# Patient Record
Sex: Female | Born: 1995
Health system: Southern US, Community
[De-identification: ages and names within clinical notes are randomized; demographics above are authoritative.]

---

## 2015-10-26 ENCOUNTER — Ambulatory Visit (INDEPENDENT_AMBULATORY_CARE_PROVIDER_SITE_OTHER): Payer: Self-pay | Admitting: Nurse Practitioner

## 2015-10-26 VITALS — BP 118/72 | HR 107 | Temp 99.3°F | Resp 20 | Ht 66.0 in | Wt 126.6 lb

## 2015-10-26 DIAGNOSIS — Z23 Encounter for immunization: Secondary | ICD-10-CM

## 2015-10-29 ENCOUNTER — Emergency Department (HOSPITAL_BASED_OUTPATIENT_CLINIC_OR_DEPARTMENT_OTHER): Payer: Medicaid Other

## 2015-10-29 ENCOUNTER — Encounter (HOSPITAL_BASED_OUTPATIENT_CLINIC_OR_DEPARTMENT_OTHER): Payer: Self-pay | Admitting: Emergency Medicine

## 2015-10-29 ENCOUNTER — Emergency Department (HOSPITAL_BASED_OUTPATIENT_CLINIC_OR_DEPARTMENT_OTHER)
Admission: EM | Admit: 2015-10-29 | Discharge: 2015-10-29 | Disposition: A | Discharge status: Home / Self Care | Payer: Medicaid Other | Attending: Emergency Medicine | Admitting: Emergency Medicine

## 2015-10-29 DIAGNOSIS — R6883 Chills (without fever): Secondary | ICD-10-CM | POA: Diagnosis not present

## 2015-10-29 DIAGNOSIS — N210 Calculus in bladder: Secondary | ICD-10-CM | POA: Diagnosis not present

## 2015-10-29 DIAGNOSIS — K8020 Calculus of gallbladder without cholecystitis without obstruction: Secondary | ICD-10-CM

## 2015-10-29 DIAGNOSIS — M549 Dorsalgia, unspecified: Secondary | ICD-10-CM | POA: Insufficient documentation

## 2015-10-29 DIAGNOSIS — R110 Nausea: Secondary | ICD-10-CM | POA: Diagnosis present

## 2015-10-29 LAB — CBC WITH DIFFERENTIAL/PLATELET
BASOS PCT: 0 %
Basophils Absolute: 0 10*3/uL (ref 0.0–0.1)
EOS ABS: 0.2 10*3/uL (ref 0.0–0.7)
Eosinophils Relative: 4 %
HCT: 39.8 % (ref 36.0–46.0)
HEMOGLOBIN: 13.5 g/dL (ref 12.0–15.0)
Lymphocytes Relative: 40 %
Lymphs Abs: 2.5 10*3/uL (ref 0.7–4.0)
MCH: 30.2 pg (ref 26.0–34.0)
MCHC: 33.9 g/dL (ref 30.0–36.0)
MCV: 89 fL (ref 78.0–100.0)
Monocytes Absolute: 0.5 10*3/uL (ref 0.1–1.0)
Monocytes Relative: 7 %
NEUTROS PCT: 49 %
Neutro Abs: 3 10*3/uL (ref 1.7–7.7)
Platelets: 264 10*3/uL (ref 150–400)
RBC: 4.47 MIL/uL (ref 3.87–5.11)
RDW: 12.2 % (ref 11.5–15.5)
WBC: 6.2 10*3/uL (ref 4.0–10.5)

## 2015-10-29 LAB — COMPREHENSIVE METABOLIC PANEL
ALK PHOS: 87 U/L (ref 38–126)
ALT: 12 U/L — ABNORMAL LOW (ref 14–54)
ANION GAP: 7 (ref 5–15)
AST: 16 U/L (ref 15–41)
Albumin: 4.5 g/dL (ref 3.5–5.0)
BILIRUBIN TOTAL: 0.5 mg/dL (ref 0.3–1.2)
BUN: 9 mg/dL (ref 6–20)
CALCIUM: 9.5 mg/dL (ref 8.9–10.3)
CO2: 25 mmol/L (ref 22–32)
Chloride: 106 mmol/L (ref 101–111)
Creatinine, Ser: 0.63 mg/dL (ref 0.44–1.00)
GFR calc non Af Amer: >60 mL/min (ref >60)
Glucose, Bld: 92 mg/dL (ref 65–99)
POTASSIUM: 4 mmol/L (ref 3.5–5.1)
SODIUM: 138 mmol/L (ref 135–145)
TOTAL PROTEIN: 7.3 g/dL (ref 6.5–8.1)

## 2015-10-29 LAB — URINALYSIS, ROUTINE W REFLEX MICROSCOPIC
BILIRUBIN URINE: NEGATIVE
GLUCOSE, UA: NEGATIVE mg/dL
HGB URINE DIPSTICK: NEGATIVE
Ketones, ur: NEGATIVE mg/dL
Nitrite: NEGATIVE
PH: 6 (ref 5.0–8.0)
Protein, ur: NEGATIVE mg/dL
SPECIFIC GRAVITY, URINE: 1.027 (ref 1.005–1.030)

## 2015-10-29 LAB — LIPASE, BLOOD: Lipase: 22 U/L (ref 11–51)

## 2015-10-29 LAB — PREGNANCY, URINE: Preg Test, Ur: NEGATIVE

## 2015-10-29 LAB — URINE MICROSCOPIC-ADD ON: RBC / HPF: NONE SEEN RBC/hpf (ref 0–5)

## 2015-10-29 MED ORDER — OMEPRAZOLE 20 MG PO CPDR: 20.0000 mg | DELAYED_RELEASE_CAPSULE | Freq: Every day | ORAL | 0 refills | Status: DC

## 2015-10-29 MED ORDER — NAPROXEN 250 MG PO TABS: 250.0000 mg | ORAL_TABLET | Freq: Two times a day (BID) | ORAL | 0 refills | Status: DC

## 2015-10-29 MED ORDER — ONDANSETRON 4 MG PO TBDP
4.0000 mg | ORAL_TABLET | Freq: Once | ORAL | Status: DC
  Filled 2015-10-29: qty 1

## 2015-10-29 MED ORDER — ONDANSETRON 4 MG PO TBDP: 4.0000 mg | ORAL_TABLET | Freq: Three times a day (TID) | ORAL | 0 refills | Status: DC | PRN

## 2015-10-29 MED ORDER — GI COCKTAIL ~~LOC~~
30.0000 mL | Freq: Once | ORAL | Status: DC
  Filled 2015-10-29: qty 30

## 2015-10-29 NOTE — ED Triage Notes (Signed)
Patient states that she is having Nausea x a few weeks. The patient reports that she may have a UTI - the patient reports fatigue

## 2015-10-29 NOTE — ED Provider Notes (Signed)
[facility] Provider Note   CSN: [id number] Arrival date & time: 10/29/15  1736  By signing my name below, I, Freida Busman, attest that this documentation has been prepared under the direction and in the presence of non-physician practitioner, Everlene Farrier, PA-C. Electronically Signed: Freida Busman, Scribe. 10/29/2015. 8:53 PM.   History   Chief Complaint Chief Complaint  Patient presents with  . Nausea    The history is provided by the patient. No language interpreter was used.     HPI Comments:  Gina Wheeler is a 20 y.o. female who presents to the Emergency Department complaining of intermittent episodes of nausea x 2-3 weeks. She notes she often feels nauseous after eating certain foods. Pt reports associated chills, fatigue, and occasional back pain. No treatments attempted.  No alleviating factors noted. She denies abdominal pain, dysuria, fever, cough, vaginal bleeding and vaginal discharge. Her last normal BM was today.  She also denies h/o abdominal surgeries. LNMP was ~ 3 weeks ago   History reviewed. No pertinent past medical history.  There are no active problems to display for this patient.   History reviewed. No pertinent surgical history.  OB History    No data available       Home Medications    Prior to Admission medications   Medication Sig Start Date End Date Taking? Authorizing Provider  naproxen (NAPROSYN) 250 MG tablet Take 1 tablet (250 mg total) by mouth 2 (two) times daily with a meal. As needed for pain 10/29/15   Everlene Farrier, PA-C  omeprazole (PRILOSEC) 20 MG capsule Take 1 capsule (20 mg total) by mouth daily. 10/29/15   Everlene Farrier, PA-C  ondansetron (ZOFRAN ODT) 4 MG disintegrating tablet Take 1 tablet (4 mg total) by mouth every 8 (eight) hours as needed for nausea or vomiting. 10/29/15   Everlene Farrier, PA-C    Family History History reviewed. No pertinent family history.  Social History Social History    Substance Use Topics  . Smoking status: Never Smoker  . Smokeless tobacco: Never Used  . Alcohol use No     Allergies   Review of patient's allergies indicates no known allergies.   Review of Systems Review of Systems  Constitutional: Positive for chills. Negative for fever.  HENT: Negative for congestion and sore throat.   Eyes: Negative for visual disturbance.  Respiratory: Negative for cough and shortness of breath.   Cardiovascular: Negative for chest pain.  Gastrointestinal: Positive for nausea. Negative for abdominal distention, abdominal pain, blood in stool, constipation, diarrhea and vomiting.  Genitourinary: Negative for dysuria, frequency, urgency, vaginal bleeding and vaginal discharge.  Musculoskeletal: Positive for back pain. Negative for neck pain.  Skin: Negative for rash.  Neurological: Negative for headaches.   Physical Exam Updated Vital Signs BP 115/82 (BP Location: Right Arm)   Pulse 82   Resp 18   Ht 5\' 6"  (1.676 m)   Wt 57.2 kg   LMP 09/14/2015 (Approximate)   SpO2 98%   BMI 20.34 kg/m   Physical Exam  Constitutional: She appears well-developed and well-nourished. No distress.  Nontoxic appearing.  HENT:  Head: Normocephalic and atraumatic.  Mouth/Throat: Oropharynx is clear and moist.  Eyes: Conjunctivae are normal. Pupils are equal, round, and reactive to light. Right eye exhibits no discharge. Left eye exhibits no discharge.  Neck: Neck supple.  Cardiovascular: Normal rate, regular rhythm, normal heart sounds and intact distal pulses.  Exam reveals no gallop and no friction rub.   No murmur  heard. Pulmonary/Chest: Effort normal and breath sounds normal. No respiratory distress. She has no wheezes. She has no rales.  Abdominal: Soft. Bowel sounds are normal. She exhibits no distension and no mass. There is no tenderness. There is no rebound and no guarding.  Abdomen soft, nontender to palpation. Bowel sounds present  No peritoneal signs   Musculoskeletal: She exhibits no edema.  Lymphadenopathy:    She has no cervical adenopathy.  Neurological: She is alert. Coordination normal.  Skin: Skin is warm and dry. Capillary refill takes less than 2 seconds. No rash noted. She is not diaphoretic. No erythema. No pallor.  Psychiatric: She has a normal mood and affect. Her behavior is normal.  Nursing note and vitals reviewed.    ED Treatments / Results  DIAGNOSTIC STUDIES:  Oxygen Saturation is 99% on RA, normal by my interpretation.    COORDINATION OF CARE:  8:50 PM Discussed treatment plan with pt at bedside and pt agreed to plan.  Labs (all labs ordered are listed, but only abnormal results are displayed) Results for orders placed or performed during the hospital encounter of 10/29/15  Pregnancy, urine  Result Value Ref Range   Preg Test, Ur NEGATIVE NEGATIVE  Urinalysis, Routine w reflex microscopic (not at [facility])  Result Value Ref Range   Color, Urine YELLOW YELLOW   APPearance CLOUDY (A) CLEAR   Specific Gravity, Urine 1.027 1.005 - 1.030   pH 6.0 5.0 - 8.0   Glucose, UA NEGATIVE NEGATIVE mg/dL   Hgb urine dipstick NEGATIVE NEGATIVE   Bilirubin Urine NEGATIVE NEGATIVE   Ketones, ur NEGATIVE NEGATIVE mg/dL   Protein, ur NEGATIVE NEGATIVE mg/dL   Nitrite NEGATIVE NEGATIVE   Leukocytes, UA SMALL (A) NEGATIVE  Urine microscopic-add on  Result Value Ref Range   Squamous Epithelial / LPF 0-5 (A) NONE SEEN   WBC, UA 0-5 0 - 5 WBC/hpf   RBC / HPF NONE SEEN 0 - 5 RBC/hpf   Bacteria, UA FEW (A) NONE SEEN   Urine-Other MUCOUS PRESENT   Comprehensive metabolic panel  Result Value Ref Range   Sodium 138 135 - 145 mmol/L   Potassium 4.0 3.5 - 5.1 mmol/L   Chloride 106 101 - 111 mmol/L   CO2 25 22 - 32 mmol/L   Glucose, Bld 92 65 - 99 mg/dL   BUN 9 6 - 20 mg/dL   Creatinine, Ser 1.61 0.44 - 1.00 mg/dL   Calcium 9.5 8.9 - 09.6 mg/dL   Total Protein 7.3 6.5 - 8.1 g/dL   Albumin 4.5 3.5 - 5.0 g/dL   AST 16 15 - 41  U/L   ALT 12 (L) 14 - 54 U/L   Alkaline Phosphatase 87 38 - 126 U/L   Total Bilirubin 0.5 0.3 - 1.2 mg/dL   GFR calc non Af Amer >60 >60 mL/min   GFR calc Af Amer >60 >60 mL/min   Anion gap 7 5 - 15  Lipase, blood  Result Value Ref Range   Lipase 22 11 - 51 U/L  CBC with Differential  Result Value Ref Range   WBC 6.2 4.0 - 10.5 K/uL   RBC 4.47 3.87 - 5.11 MIL/uL   Hemoglobin 13.5 12.0 - 15.0 g/dL   HCT 04.5 40.9 - 81.1 %   MCV 89.0 78.0 - 100.0 fL   MCH 30.2 26.0 - 34.0 pg   MCHC 33.9 30.0 - 36.0 g/dL   RDW 91.4 78.2 - 95.6 %   Platelets 264 150 - 400 K/uL  Neutrophils Relative % 49 %   Neutro Abs 3.0 1.7 - 7.7 K/uL   Lymphocytes Relative 40 %   Lymphs Abs 2.5 0.7 - 4.0 K/uL   Monocytes Relative 7 %   Monocytes Absolute 0.5 0.1 - 1.0 K/uL   Eosinophils Relative 4 %   Eosinophils Absolute 0.2 0.0 - 0.7 K/uL   Basophils Relative 0 %   Basophils Absolute 0.0 0.0 - 0.1 K/uL    EKG  EKG Interpretation None       Radiology US Abdomen Limited Ruq  Result Date: 10/29/2015 CLINICAL DATA:  Nausea for 3 weeks. EXAM: US ABDOMEN LIMITED - RIGHT UPPER QUADRANT COMPARISON:  None. FINDINGS: Gallbladder: Gallbladder is mildly contracted. This is nonspecific but may indicate chronic dysmotility in a fasting patient. Small amount of sludge in the gallbladder. No stones or wall thickening. Murphy's sign is negative. Common bile duct: Diameter: 1 mm, normal Liver: No focal lesion identified. Within normal limits in parenchymal echogenicity. IMPRESSION: Gallbladder is mildly contracted with sludge possibly indicating decreased motility. No changes to suggest acute cholecystitis. Electronically Signed   By: Burman Nieves M.D.   On: 10/29/2015 22:25    Procedures Procedures (including critical care time)  Medications Ordered in ED Medications  gi cocktail (Maalox,Lidocaine,Donnatal) (30 mLs Oral Not Given 10/29/15 2102)  ondansetron (ZOFRAN-ODT) disintegrating tablet 4 mg (4 mg Oral  Not Given 10/29/15 2103)     Initial Impression / Assessment and Plan / ED Course  I have reviewed the triage vital signs and the nursing notes.  Pertinent labs & imaging results that were available during my care of the patient were reviewed by me and considered in my medical decision making (see chart for details).  Clinical Course     Patient presented to the emergency department complaining of intermittent nausea for the past 2-3 weeks but she also reports some chills and fatigue. No abdominal pain. No vomiting. On exam the patient is afebrile nontoxic appearing. Her abdomen is soft and nontender to palpation. She reports symptoms are worse with eating. Urinalysis is nitrite negative with small leukocytes. Urine sent for culture. Patient denied any urinary symptoms. Pregnancy test is negative. CBC is within normal limits. Lipase is within normal limits. CMP is unremarkable. No elevated liver enzymes. Right upper quadrant ultrasound revealed gallbladder is mildly contracted with sludge possibly indicating decreased motility. No changes to suggest acute cholecystitis. I discussed these findings with the patient. She is tolerating ginger ale and crackers in the room. Will discharge with prescriptions for Zofran and omeprazole. Will have her follow-up with general surgery as she possibly will require cholecystectomy. I discussed diet changes to help with gallbladder colic. I advised the patient to follow-up with their primary care provider this week. I advised the patient to return to the emergency department with new or worsening symptoms or new concerns. The patient verbalized understanding and agreement with plan.       Final Clinical Impressions(s) / ED Diagnoses   Final diagnoses:  Nausea  Gallbladder colic  Nausea without vomiting    New Prescriptions Discharge Medication List as of 10/29/2015 10:58 PM    START taking these medications   Details  naproxen (NAPROSYN) 250 MG  tablet Take 1 tablet (250 mg total) by mouth 2 (two) times daily with a meal. As needed for pain, Starting Thu 10/29/2015, Print    omeprazole (PRILOSEC) 20 MG capsule Take 1 capsule (20 mg total) by mouth daily., Starting Thu 10/29/2015, Print    ondansetron [facility]  ODT) 4 MG disintegrating tablet Take 1 tablet (4 mg total) by mouth every 8 (eight) hours as needed for nausea or vomiting., Starting Thu 10/29/2015, Print       I personally performed the services described in this documentation, which was scribed in my presence. The recorded information has been reviewed and is accurate.       Everlene FarrierWilliam Vint Pola, PA-C 10/30/15 0143    Benjiman CoreNathan Pickering, MD 10/31/15 [phone]

## 2015-11-01 LAB — URINE CULTURE

## 2015-11-05 ENCOUNTER — Ambulatory Visit: Payer: Self-pay | Attending: Internal Medicine | Admitting: Physician Assistant

## 2015-11-05 ENCOUNTER — Encounter: Payer: Self-pay | Admitting: Physician Assistant

## 2015-11-05 VITALS — BP 108/71 | HR 95 | Temp 98.4°F | Resp 18 | Ht 66.5 in | Wt 126.8 lb

## 2015-11-05 DIAGNOSIS — R12 Heartburn: Secondary | ICD-10-CM

## 2015-11-05 DIAGNOSIS — R1013 Epigastric pain: Secondary | ICD-10-CM

## 2015-11-05 MED ORDER — OMEPRAZOLE 20 MG PO CPDR: 20.0000 mg | DELAYED_RELEASE_CAPSULE | Freq: Every day | ORAL | 2 refills | Status: AC

## 2015-11-05 NOTE — Progress Notes (Signed)
Gina Wheeler, is a 20 y.o. female  WUJ:[id number]:[id number]  [id number]  DOB - [birth year]  Subjective:  Chief Complaint and HPI: Gina Wheeler is a 20 y.o. female here today to establish care and for a follow up visit after being seen in the ED for nausea without vomiting on 10/29/2015. She has been having nausea and intermittent abdominal pain for 2-3 weeks.   Gallbladder U/S showed:  IMPRESSION: Gallbladder is mildly contracted with sludge possibly indicating decreased motility. No changes to suggest acute cholecystitis CMP, CBC, urine pregnancy and UA all negative/WNL  She continues to have intermittent mid-epigastric pain that is worse with eating certain foods.  She continues to have intermittent nausea.  Her appetite is good.  No vomiting or diarrhea.  She denies urinary symptoms or vaginal discharge.  No pelvic pain.  [place] travelled to [place] about 2 [place] ago.  No f/c.    ED/Hospital notes reviewed.     ROS:   Constitutional:  No f/c, No night sweats, No unexplained weight loss. EENT:  No vision changes, No blurry vision, No hearing changes. No mouth, throat, or ear problems.  Respiratory: No cough, No SOB Cardiac: No CP, no palpitations GI:  +abd pain, +nausea, no V/D. GU: No Urinary s/sx Musculoskeletal: No joint pain Neuro: No headache, no dizziness, no motor weakness.  Skin: No rash Endocrine:  No polydipsia. No polyuria.  Psych: Denies SI/HI  No problems updated.  ALLERGIES: No Known Allergies  PAST MEDICAL HISTORY: No past medical history on file.  MEDICATIONS AT HOME: Prior to Admission medications   Medication Sig Start Date End Date Taking? Authorizing Provider  naproxen (NAPROSYN) 250 MG tablet Take 1 tablet (250 mg total) by mouth 2 (two) times daily with a meal. As needed for pain 10/29/15  Yes Everlene FarrierWilliam Dansie, PA-C  omeprazole (PRILOSEC) 20 MG capsule Take 1 capsule (20 mg total) by mouth daily. 11/05/15   Anders SimmondsAngela M McClung, PA-C    ondansetron (ZOFRAN ODT) 4 MG disintegrating tablet Take 1 tablet (4 mg total) by mouth every 8 (eight) hours as needed for nausea or vomiting. Patient not taking: Reported on 11/05/2015 10/29/15   Everlene FarrierWilliam Dansie, PA-C     Objective:  EXAM:   Vitals:   11/05/15 1438  BP: 108/71  Pulse: 95  Resp: 18  Temp: 98.4 F (36.9 C)  TempSrc: Oral  SpO2: 99%  Weight: 126 lb 12.8 oz (57.5 kg)  Height: 5' 6.5" (1.689 m)    General appearance : A&OX3. NAD. Non-toxic-appearing HEENT: Atraumatic and Normocephalic.  PERRLA. EOM intact.  TM clear B. Mouth-MMM, post pharynx WNL w/o erythema, No PND. Neck: supple, no JVD. No cervical lymphadenopathy. No thyromegaly Chest/Lungs:  Breathing-non-labored, Good air entry bilaterally, breath sounds normal without rales, rhonchi, or wheezing  CVS: S1 S2 regular, no murmurs, gallops, rubs  Abdomen: Bowel sounds present, Non tender and not distended with no gaurding, rigidity or rebound. Extremities: Bilateral Lower Ext shows no edema, both legs are warm to touch with = pulse throughout Neurology:  CN II-XII grossly intact, Non focal.   Psych:  TP linear. J/I WNL. Normal speech. Appropriate eye contact and affect.  Skin:  No Rash  Data Review No results found for: HGBA1C   Assessment & Plan   1. Heartburn - omeprazole (PRILOSEC) 20 MG capsule; Take 1 capsule (20 mg total) by mouth daily.  Dispense: 30 capsule; Refill: 2 - H. pylori breath test  2. Midepigastric pain +decreased gallbladder motility possible on U/S.  Consider GI  referral if s/sx persist.  - H. pylori breath test Reflux/heartburn diet recommended  Patient have been counseled extensively about nutrition and exercise  Return in about 4 weeks (around 12/03/2015) for establish with PCP and recheck nausea.  The patient was given clear instructions to go to ER or return to medical center if symptoms don't improve, worsen or new problems develop. The patient verbalized understanding.  The patient was told to call to get lab results if they haven't heard anything in the next week.     Georgian Co, PA-C [facility] [place], [place] [phone]   11/05/2015, 3:11 PMPatient ID: Gina Wheeler, female   DOB: 07-Apr-1995, 20 y.o.   MRN: [id number]

## 2015-11-05 NOTE — Patient Instructions (Signed)
Food Choices for Gastroesophageal Reflux Disease, Adult When you have gastroesophageal reflux disease (GERD), the foods you eat and your eating habits are very important. Choosing the right foods can help ease the discomfort of GERD. WHAT GENERAL GUIDELINES DO I NEED TO FOLLOW?  Choose fruits, vegetables, whole grains, low-fat dairy products, and low-fat meat, fish, and poultry.  Limit fats such as oils, salad dressings, butter, nuts, and avocado.  Keep a food diary to identify foods that cause symptoms.  Avoid foods that cause reflux. These may be different for different people.  Eat frequent small meals instead of three large meals each day.  Eat your meals slowly, in a relaxed setting.  Limit fried foods.  Cook foods using methods other than frying.  Avoid drinking alcohol.  Avoid drinking large amounts of liquids with your meals.  Avoid bending over or lying down until 2-3 hours after eating. WHAT FOODS ARE NOT RECOMMENDED? The following are some foods and drinks that may worsen your symptoms: Vegetables Tomatoes. Tomato juice. Tomato and spaghetti sauce. Chili peppers. Onion and garlic. Horseradish. Fruits Oranges, grapefruit, and lemon (fruit and juice). Meats High-fat meats, fish, and poultry. This includes hot dogs, ribs, ham, sausage, salami, and bacon. Dairy Whole milk and chocolate milk. Sour cream. Cream. Butter. Ice cream. Cream cheese.  Beverages Coffee and tea, with or without caffeine. Carbonated beverages or energy drinks. Condiments Hot sauce. Barbecue sauce.  Sweets/Desserts Chocolate and cocoa. Donuts. Peppermint and spearmint. Fats and Oils High-fat foods, including French fries and potato chips. Other Vinegar. Strong spices, such as black pepper, white pepper, red pepper, cayenne, curry powder, cloves, ginger, and chili powder. The items listed above may not be a complete list of foods and beverages to avoid. Contact your dietitian for more  information.   This information is not intended to replace advice given to you by your health care provider. Make sure you discuss any questions you have with your health care provider.   Document Released: 01/24/2005 Document Revised: 02/14/2014 Document Reviewed: 11/28/2012 Elsevier Interactive Patient Education 2016 [employer]  

## 2015-11-06 LAB — H. PYLORI BREATH TEST: H. pylori Breath Test: NOT DETECTED

## 2015-11-12 ENCOUNTER — Telehealth: Payer: Self-pay

## 2015-11-12 ENCOUNTER — Telehealth: Payer: Self-pay | Admitting: General Practice

## 2015-11-12 NOTE — Telephone Encounter (Signed)
Patient returned nurse phone call. °Please follow up. °

## 2015-11-12 NOTE — Telephone Encounter (Signed)
Contacted pt to go over lab results pt didn't answer asked pt to give me a call back at her earliest convenience

## 2015-11-24 ENCOUNTER — Ambulatory Visit (INDEPENDENT_AMBULATORY_CARE_PROVIDER_SITE_OTHER): Payer: Self-pay | Admitting: Family Medicine

## 2015-11-24 ENCOUNTER — Other Ambulatory Visit: Payer: Self-pay | Admitting: *Deleted

## 2015-11-24 DIAGNOSIS — Z111 Encounter for screening for respiratory tuberculosis: Secondary | ICD-10-CM

## 2015-11-27 LAB — TB SKIN TEST
Induration: 0 mm
TB SKIN TEST: NEGATIVE

## 2015-12-01 ENCOUNTER — Ambulatory Visit: Payer: Medicaid Other | Attending: Internal Medicine | Admitting: Internal Medicine

## 2015-12-01 ENCOUNTER — Encounter: Payer: Self-pay | Admitting: Internal Medicine

## 2015-12-01 VITALS — BP 110/76 | HR 91 | Temp 99.0°F | Resp 16 | Wt 120.6 lb

## 2015-12-01 DIAGNOSIS — Z23 Encounter for immunization: Secondary | ICD-10-CM

## 2015-12-01 DIAGNOSIS — Z79899 Other long term (current) drug therapy: Secondary | ICD-10-CM | POA: Diagnosis not present

## 2015-12-01 DIAGNOSIS — K219 Gastro-esophageal reflux disease without esophagitis: Secondary | ICD-10-CM | POA: Insufficient documentation

## 2015-12-01 DIAGNOSIS — Z131 Encounter for screening for diabetes mellitus: Secondary | ICD-10-CM

## 2015-12-01 DIAGNOSIS — R634 Abnormal weight loss: Secondary | ICD-10-CM | POA: Insufficient documentation

## 2015-12-01 DIAGNOSIS — R8299 Other abnormal findings in urine: Secondary | ICD-10-CM | POA: Insufficient documentation

## 2015-12-01 DIAGNOSIS — R8290 Unspecified abnormal findings in urine: Secondary | ICD-10-CM

## 2015-12-01 DIAGNOSIS — E46 Unspecified protein-calorie malnutrition: Secondary | ICD-10-CM | POA: Diagnosis not present

## 2015-12-01 DIAGNOSIS — E441 Mild protein-calorie malnutrition: Secondary | ICD-10-CM

## 2015-12-01 DIAGNOSIS — Z113 Encounter for screening for infections with a predominantly sexual mode of transmission: Secondary | ICD-10-CM

## 2015-12-01 LAB — POCT URINALYSIS DIPSTICK
BILIRUBIN UA: NEGATIVE
Blood, UA: NEGATIVE
Glucose, UA: NEGATIVE
KETONES UA: 15
Nitrite, UA: NEGATIVE
PROTEIN UA: 30
Spec Grav, UA: 1.02
Urobilinogen, UA: 2
pH, UA: 7

## 2015-12-01 LAB — POCT GLYCOSYLATED HEMOGLOBIN (HGB A1C): Hemoglobin A1C: 5.1

## 2015-12-01 NOTE — Patient Instructions (Addendum)
Tdap Vaccine (Tetanus, Diphtheria and Pertussis): What You Need to Know 1. Why get vaccinated? Tetanus, diphtheria and pertussis are very serious diseases. Tdap vaccine can protect us from these diseases. And, Tdap vaccine given to pregnant women can protect newborn babies against pertussis. TETANUS (Lockjaw) is rare in the [place] today. It causes painful muscle tightening and stiffness, usually all over the body.  It can lead to tightening of muscles in the head and neck so you can't open your mouth, swallow, or sometimes even breathe. Tetanus kills about 1 out of 10 people who are infected even after receiving the best medical care. DIPHTHERIA is also rare in the [place] today. It can cause a thick coating to form in the back of the throat.  It can lead to breathing problems, heart failure, paralysis, and death. PERTUSSIS (Whooping Cough) causes severe coughing spells, which can cause difficulty breathing, vomiting and disturbed sleep.  It can also lead to weight loss, incontinence, and rib fractures. Up to 2 in 100 adolescents and 5 in 100 adults with pertussis are hospitalized or have complications, which could include pneumonia or death. These diseases are caused by bacteria. Diphtheria and pertussis are spread from person to person through secretions from coughing or sneezing. Tetanus enters the body through cuts, scratches, or wounds. Before vaccines, as many as 200,000 cases of diphtheria, 200,000 cases of pertussis, and hundreds of cases of tetanus, were reported in the [place] each year. Since vaccination began, reports of cases for tetanus and diphtheria have dropped by about 99% and for pertussis by about 80%. 2. Tdap vaccine Tdap vaccine can protect adolescents and adults from tetanus, diphtheria, and pertussis. One dose of Tdap is routinely given at age 11 or 12. People who did not get Tdap at that age should get it as soon as possible. Tdap is especially important  for healthcare professionals and anyone having close contact with a baby younger than 12 months. Pregnant women should get a dose of Tdap during every pregnancy, to protect the newborn from pertussis. Infants are most at risk for severe, life-threatening complications from pertussis. Another vaccine, called Td, protects against tetanus and diphtheria, but not pertussis. A Td booster should be given every 10 years. Tdap may be given as one of these boosters if you have never gotten Tdap before. Tdap may also be given after a severe cut or burn to prevent tetanus infection. Your doctor or the person giving you the vaccine can give you more information. Tdap may safely be given at the same time as other vaccines. 3. Some people should not get this vaccine  A person who has ever had a life-threatening allergic reaction after a previous dose of any diphtheria, tetanus or pertussis containing vaccine, OR has a severe allergy to any part of this vaccine, should not get Tdap vaccine. Tell the person giving the vaccine about any severe allergies.  Anyone who had coma or long repeated seizures within 7 days after a childhood dose of DTP or DTaP, or a previous dose of Tdap, should not get Tdap, unless a cause other than the vaccine was found. They can still get Td.  Talk to your doctor if you:  have seizures or another nervous system problem,  had severe pain or swelling after any vaccine containing diphtheria, tetanus or pertussis,  ever had a condition called Guillain-Barr Syndrome (GBS),  aren't feeling well on the day the shot is scheduled. 4. Risks With any medicine, including vaccines, there is   a chance of side effects. These are usually mild and go away on their own. Serious reactions are also possible but are rare. Most people who get Tdap vaccine do not have any problems with it. Mild problems following Tdap (Did not interfere with activities)  Pain where the shot was given (about 3 in 4  adolescents or 2 in 3 adults)  Redness or swelling where the shot was given (about 1 person in 5)  Mild fever of at least 100.4F (up to about 1 in 25 adolescents or 1 in 100 adults)  Headache (about 3 or 4 people in 10)  Tiredness (about 1 person in 3 or 4)  Nausea, vomiting, diarrhea, stomach ache (up to 1 in 4 adolescents or 1 in 10 adults)  Chills, sore joints (about 1 person in 10)  Body aches (about 1 person in 3 or 4)  Rash, swollen glands (uncommon) Moderate problems following Tdap (Interfered with activities, but did not require medical attention)  Pain where the shot was given (up to 1 in 5 or 6)  Redness or swelling where the shot was given (up to about 1 in 16 adolescents or 1 in 12 adults)  Fever over 102F (about 1 in 100 adolescents or 1 in 250 adults)  Headache (about 1 in 7 adolescents or 1 in 10 adults)  Nausea, vomiting, diarrhea, stomach ache (up to 1 or 3 people in 100)  Swelling of the entire arm where the shot was given (up to about 1 in 500). Severe problems following Tdap (Unable to perform usual activities; required medical attention)  Swelling, severe pain, bleeding and redness in the arm where the shot was given (rare). Problems that could happen after any vaccine:  People sometimes faint after a medical procedure, including vaccination. Sitting or lying down for about 15 minutes can help prevent fainting, and injuries caused by a fall. Tell your doctor if you feel dizzy, or have vision changes or ringing in the ears.  Some people get severe pain in the shoulder and have difficulty moving the arm where a shot was given. This happens very rarely.  Any medication can cause a severe allergic reaction. Such reactions from a vaccine are very rare, estimated at fewer than 1 in a million doses, and would happen within a few minutes to a few hours after the vaccination. As with any medicine, there is a very remote chance of a vaccine causing a serious  injury or death. The safety of vaccines is always being monitored. For more information, visit: www.cdc.gov/vaccinesafety/ 5. What if there is a serious problem? What should I look for?  Look for anything that concerns you, such as signs of a severe allergic reaction, very high fever, or unusual behavior.  Signs of a severe allergic reaction can include hives, swelling of the face and throat, difficulty breathing, a fast heartbeat, dizziness, and weakness. These would usually start a few minutes to a few hours after the vaccination. What should I do?  If you think it is a severe allergic reaction or other emergency that can't wait, call 9-1-1 or get the person to the nearest hospital. Otherwise, call your doctor.  Afterward, the reaction should be reported to the Vaccine Adverse Event Reporting System (VAERS). Your doctor might file this report, or you can do it yourself through the VAERS web site at www.vaers.hhs.gov, or by calling 1-800-822-7967. VAERS does not give medical advice.  6. The National Vaccine Injury Compensation Program The National Vaccine Injury Compensation Program (  VICP) is a federal program that was created to compensate people who may have been injured by certain vaccines. Persons who believe they may have been injured by a vaccine can learn about the program and about filing a claim by calling 1-(318)044-5983 or visiting the VICP website at SpiritualWord.at. There is a time limit to file a claim for compensation. 7. How can I learn more?  Ask your doctor. He or she can give you the vaccine package insert or suggest other sources of information.  Call your local or state health department.  Contact the Centers for Disease Control and Prevention (CDC):  Call 239-031-8492 (1-800-CDC-INFO) or  Visit CDC's website at PicCapture.uy CDC Tdap Vaccine VIS (04/02/13)   This information is not intended to replace advice given to you by your health care  provider. Make sure you discuss any questions you have with your health care provider.   Document Released: 07/26/2011 Document Revised: 02/14/2014 Document Reviewed: 05/08/2013 Elsevier Interactive Patient Education 2016 [employer]   - Health Maintenance, Female Adopting a healthy lifestyle and getting preventive care can go a long way to promote health and wellness. Talk with your health care provider about what schedule of regular examinations is right for you. This is a good chance for you to check in with your provider about disease prevention and staying healthy. In between checkups, there are plenty of things you can do on your own. Experts have done a lot of research about which lifestyle changes and preventive measures are most likely to keep you healthy. Ask your health care provider for more information. WEIGHT AND DIET  Eat a healthy diet  Be sure to include plenty of vegetables, fruits, low-fat dairy products, and lean protein.  Do not eat a lot of foods high in solid fats, added sugars, or salt.  Get regular exercise. This is one of the most important things you can do for your health.  Most adults should exercise for at least 150 minutes each week. The exercise should increase your heart rate and make you sweat (moderate-intensity exercise).  Most adults should also do strengthening exercises at least twice a week. This is in addition to the moderate-intensity exercise.  Maintain a healthy weight  Body mass index (BMI) is a measurement that can be used to identify possible weight problems. It estimates body fat based on height and weight. Your health care provider can help determine your BMI and help you achieve or maintain a healthy weight.  For females 13 years of age and older:   A BMI below 18.5 is considered underweight.  A BMI of 18.5 to 24.9 is normal.  A BMI of 25 to 29.9 is considered overweight.  A BMI of 30 and above is considered obese.  Watch  levels of cholesterol and blood lipids  You should start having your blood tested for lipids and cholesterol at 20 years of age, then have this test every 5 years.  You may need to have your cholesterol levels checked more often if:  Your lipid or cholesterol levels are high.  You are older than 20 years of age.  You are at high risk for heart disease.  CANCER SCREENING   Lung Cancer  Lung cancer screening is recommended for adults 44-68 years old who are at high risk for lung cancer because of a history of smoking.  A yearly low-dose CT scan of the lungs is recommended for people who:  Currently smoke.  Have quit within the past 15  years.  Have at least a 30-pack-year history of smoking. A pack year is smoking an average of one pack of cigarettes a day for 1 year.  Yearly screening should continue until it has been 15 years since you quit.  Yearly screening should stop if you develop a health problem that would prevent you from having lung cancer treatment.  Breast Cancer  Practice breast self-awareness. This means understanding how your breasts normally appear and feel.  It also means doing regular breast self-exams. Let your health care provider know about any changes, no matter how small.  If you are in your 20s or 30s, you should have a clinical breast exam (CBE) by a health care provider every 1-3 years as part of a regular health exam.  If you are 43 or older, have a CBE every year. Also consider having a breast X-ray (mammogram) every year.  If you have a family history of breast cancer, talk to your health care provider about genetic screening.  If you are at high risk for breast cancer, talk to your health care provider about having an MRI and a mammogram every year.  Breast cancer gene (BRCA) assessment is recommended for women who have family members with BRCA-related cancers. BRCA-related cancers include:  Breast.  Ovarian.  Tubal.  Peritoneal  cancers.  Results of the assessment will determine the need for genetic counseling and BRCA1 and BRCA2 testing. Cervical Cancer Your health care provider may recommend that you be screened regularly for cancer of the pelvic organs (ovaries, uterus, and vagina). This screening involves a pelvic examination, including checking for microscopic changes to the surface of your cervix (Pap test). You may be encouraged to have this screening done every 3 years, beginning at age 15.  For women ages 74-65, health care providers may recommend pelvic exams and Pap testing every 3 years, or they may recommend the Pap and pelvic exam, combined with testing for human papilloma virus (HPV), every 5 years. Some types of HPV increase your risk of cervical cancer. Testing for HPV may also be done on women of any age with unclear Pap test results.  Other health care providers may not recommend any screening for nonpregnant women who are considered low risk for pelvic cancer and who do not have symptoms. Ask your health care provider if a screening pelvic exam is right for you.  If you have had past treatment for cervical cancer or a condition that could lead to cancer, you need Pap tests and screening for cancer for at least 20 years after your treatment. If Pap tests have been discontinued, your risk factors (such as having a new sexual partner) need to be reassessed to determine if screening should resume. Some women have medical problems that increase the chance of getting cervical cancer. In these cases, your health care provider may recommend more frequent screening and Pap tests. Colorectal Cancer  This type of cancer can be detected and often prevented.  Routine colorectal cancer screening usually begins at 20 years of age and continues through 20 years of age.  Your health care provider may recommend screening at an earlier age if you have risk factors for colon cancer.  Your health care provider may also  recommend using home test kits to check for hidden blood in the stool.  A small camera at the end of a tube can be used to examine your colon directly (sigmoidoscopy or colonoscopy). This is done to check for the earliest forms of colorectal  cancer.  Routine screening usually begins at age 76.  Direct examination of the colon should be repeated every 5-10 years through 20 years of age. However, you may need to be screened more often if early forms of precancerous polyps or small growths are found. Skin Cancer  Check your skin from head to toe regularly.  Tell your health care provider about any new moles or changes in moles, especially if there is a change in a mole's shape or color.  Also tell your health care provider if you have a mole that is larger than the size of a pencil eraser.  Always use sunscreen. Apply sunscreen liberally and repeatedly throughout the day.  Protect yourself by wearing long sleeves, pants, a wide-brimmed hat, and sunglasses whenever you are outside. HEART DISEASE, DIABETES, AND HIGH BLOOD PRESSURE   High blood pressure causes heart disease and increases the risk of stroke. High blood pressure is more likely to develop in:  People who have blood pressure in the high end of the normal range (130-139/85-89 mm Hg).  People who are overweight or obese.  People who are African American.  If you are 76-73 years of age, have your blood pressure checked every 3-5 years. If you are 59 years of age or older, have your blood pressure checked every year. You should have your blood pressure measured twice--once when you are at a hospital or clinic, and once when you are not at a hospital or clinic. Record the average of the two measurements. To check your blood pressure when you are not at a hospital or clinic, you can use:  An automated blood pressure machine at a pharmacy.  A home blood pressure monitor.  If you are between 18 years and 5 years old, ask your health  care provider if you should take aspirin to prevent strokes.  Have regular diabetes screenings. This involves taking a blood sample to check your fasting blood sugar level.  If you are at a normal weight and have a low risk for diabetes, have this test once every three years after 20 years of age.  If you are overweight and have a high risk for diabetes, consider being tested at a younger age or more often. PREVENTING INFECTION  Hepatitis B  If you have a higher risk for hepatitis B, you should be screened for this virus. You are considered at high risk for hepatitis B if:  You were born in a country where hepatitis B is common. Ask your health care provider which countries are considered high risk.  Your parents were born in a high-risk country, and you have not been immunized against hepatitis B (hepatitis B vaccine).  You have HIV or AIDS.  You use needles to inject street drugs.  You live with someone who has hepatitis B.  You have had sex with someone who has hepatitis B.  You get hemodialysis treatment.  You take certain medicines for conditions, including cancer, organ transplantation, and autoimmune conditions. Hepatitis C  Blood testing is recommended for:  Everyone born from [birth year] through 1965.  Anyone with known risk factors for hepatitis C. Sexually transmitted infections (STIs)  You should be screened for sexually transmitted infections (STIs) including gonorrhea and chlamydia if:  You are sexually active and are younger than 20 years of age.  You are older than 20 years of age and your health care provider tells you that you are at risk for this type of infection.  Your sexual activity has  changed since you were last screened and you are at an increased risk for chlamydia or gonorrhea. Ask your health care provider if you are at risk.  If you do not have HIV, but are at risk, it may be recommended that you take a prescription medicine daily to prevent HIV  infection. This is called pre-exposure prophylaxis (PrEP). You are considered at risk if:  You are sexually active and do not regularly use condoms or know the HIV status of your partner(s).  You take drugs by injection.  You are sexually active with a partner who has HIV. Talk with your health care provider about whether you are at high risk of being infected with HIV. If you choose to begin PrEP, you should first be tested for HIV. You should then be tested every 3 months for as long as you are taking PrEP.  PREGNANCY   If you are premenopausal and you may become pregnant, ask your health care provider about preconception counseling.  If you may become pregnant, take 400 to 800 micrograms (mcg) of folic acid every day.  If you want to prevent pregnancy, talk to your health care provider about birth control (contraception). OSTEOPOROSIS AND MENOPAUSE   Osteoporosis is a disease in which the bones lose minerals and strength with aging. This can result in serious bone fractures. Your risk for osteoporosis can be identified using a bone density scan.  If you are 62 years of age or older, or if you are at risk for osteoporosis and fractures, ask your health care provider if you should be screened.  Ask your health care provider whether you should take a calcium or vitamin D supplement to lower your risk for osteoporosis.  Menopause may have certain physical symptoms and risks.  Hormone replacement therapy may reduce some of these symptoms and risks. Talk to your health care provider about whether hormone replacement therapy is right for you.  HOME CARE INSTRUCTIONS   Schedule regular health, dental, and eye exams.  Stay current with your immunizations.   Do not use any tobacco products including cigarettes, chewing tobacco, or electronic cigarettes.  If you are pregnant, do not drink alcohol.  If you are breastfeeding, limit how much and how often you drink alcohol.  Limit  alcohol intake to no more than 1 drink per day for nonpregnant women. One drink equals 12 ounces of beer, 5 ounces of wine, or 1 ounces of hard liquor.  Do not use street drugs.  Do not share needles.  Ask your health care provider for help if you need support or information about quitting drugs.  Tell your health care provider if you often feel depressed.  Tell your health care provider if you have ever been abused or do not feel safe at home.   This information is not intended to replace advice given to you by your health care provider. Make sure you discuss any questions you have with your health care provider.   Document Released: 08/09/2010 Document Revised: 02/14/2014 Document Reviewed: 12/26/2012 Elsevier Interactive Patient Education 2016 [employer].   - Safe Sex Safe sex is about reducing the risk of giving or getting a sexually transmitted disease (STD). STDs are spread through sexual contact involving the genitals, mouth, or rectum. Some STDs can be cured and others cannot. Safe sex can also prevent unintended pregnancies.  WHAT ARE SOME SAFE SEX PRACTICES?  Limit your sexual activity to only one partner who is having sex with only you.  Talk  to your partner about his or her past partners, past STDs, and drug use.  Use a condom every time you have sexual intercourse. This includes vaginal, oral, and anal sexual activity. Both females and males should wear condoms during oral sex. Only use latex or polyurethane condoms and water-based lubricants. Using petroleum-based lubricants or oils to lubricate a condom will weaken the condom and increase the chance that it will break. The condom should be in place from the beginning to the end of sexual activity. Wearing a condom reduces, but does not completely eliminate, your risk of getting or giving an STD. STDs can be spread by contact with infected body fluids and skin.  Get vaccinated for hepatitis B and HPV.  Avoid alcohol  and recreational drugs, which can affect your judgment. You may forget to use a condom or participate in high-risk sex.  For females, avoid douching after sexual intercourse. Douching can spread an infection farther into the reproductive tract.  Check your body for signs of sores, blisters, rashes, or unusual discharge. See your health care provider if you notice any of these signs.  Avoid sexual contact if you have symptoms of an infection or are being treated for an STD. If you or your partner has herpes, avoid sexual contact when blisters are present. Use condoms at all other times.  If you are at risk of being infected with HIV, it is recommended that you take a prescription medicine daily to prevent HIV infection. This is called pre-exposure prophylaxis (PrEP). You are considered at risk if:  You are a man who has sex with other men (MSM).  You are a heterosexual man or woman who is sexually active with more than one partner.  You take drugs by injection.  You are sexually active with a partner who has HIV.  Talk with your health care provider about whether you are at high risk of being infected with HIV. If you choose to begin PrEP, you should first be tested for HIV. You should then be tested every 3 months for as long as you are taking PrEP.  See your health care provider for regular screenings, exams, and tests for other STDs. Before having sex with a new partner, each of you should be screened for STDs and should talk about the results with each other. WHAT ARE THE BENEFITS OF SAFE SEX?   There is less chance of getting or giving an STD.  You can prevent unwanted or unintended pregnancies.  By discussing safe sex concerns with your partner, you may increase feelings of intimacy, comfort, trust, and honesty between the two of you.   This information is not intended to replace advice given to you by your health care provider. Make sure you discuss any questions you have with your  health care provider.   Document Released: 03/03/2004 Document Revised: 02/14/2014 Document Reviewed: 07/18/2011 Elsevier Interactive Patient Education [employer].

## 2015-12-01 NOTE — Progress Notes (Signed)
Gina Wheeler, is a 20 y.o. female  ZOX:[id number]  [id number]:[id number]  MRN:[id number]  DOB - [birth year]  CC:  Chief Complaint  Patient presents with  . Establish Care       HPI: Gina Wheeler is a 20 y.o. female here today to establish medical care.  New to our clinic.  Seen in ED 10/29/15 for gb colic and gerd. She has done w/ with adjusting her diet to less fatty foods/less fast food, smaller meals. She is taking the ppi daily, but does not know if still needs.  She is concerned about her weightloss. Was in [place] for 2 months this summer, when came back, noticed 20lb weight loss.  She feels like she is still losing weight, unintentionally.  Eats smaller meals, but describes eating proportioned, healthy small meals. Denies exercising excessively.  Does not smoke or drink. She is sexually active, but denies penetration at this time. Normal menses.  Patient has No headache, No chest pain, No abdominal pain - No Nausea, No new weakness tingling or numbness, No Cough - SOB.  Denies abd pain/d/c/brbpr/hematochezia/hemetemesis.    Review of Systems: Per HPI, o/w all systems reviewed and negative.  No Known Allergies No past medical history on file. Current Outpatient Prescriptions on File Prior to Visit  Medication Sig Dispense Refill  . naproxen (NAPROSYN) 250 MG tablet Take 1 tablet (250 mg total) by mouth 2 (two) times daily with a meal. As needed for pain 30 tablet 0  . omeprazole (PRILOSEC) 20 MG capsule Take 1 capsule (20 mg total) by mouth daily. 30 capsule 2  . ondansetron (ZOFRAN ODT) 4 MG disintegrating tablet Take 1 tablet (4 mg total) by mouth every 8 (eight) hours as needed for nausea or vomiting. (Patient not taking: Reported on 12/01/2015) 10 tablet 0   No current facility-administered medications on file prior to visit.    No family history on file. Social History   Social History  . Marital status: Single    Spouse name: N/A  . Number of children: N/A    . Years of education: N/A   Occupational History  . Not on file.   Social History Main Topics  . Smoking status: Never Smoker  . Smokeless tobacco: Never Used  . Alcohol use No  . Drug use: Unknown  . Sexual activity: No   Other Topics Concern  . Not on file   Social History Narrative  . No narrative on file    Objective:   Vitals:   12/01/15 1538  BP: 110/76  Pulse: 91  Resp: 16  Temp: 99 F (37.2 C)    Filed Weights   12/01/15 1538  Weight: 120 lb 9.6 oz (54.7 kg)    BP Readings from Last 3 Encounters:  12/01/15 110/76  11/05/15 108/71  10/29/15 115/82   bmi 19  Physical Exam: Constitutional: Patient appears well-developed and well-nourished. No distress. AAOx3,   HENT: Normocephalic, atraumatic, External right and left ear normal. Oropharynx is clear and moist.  Eyes: Conjunctivae and EOM are normal. PERRL, no scleral icterus. Neck: Normal ROM. Neck supple. No JVD. No tracheal deviation. No thyromegaly. No goiter. CVS: RRR, S1/S2 +, no murmurs, no gallops, no carotid bruit.  Pulmonary: Effort and breath sounds normal, no stridor, rhonchi, wheezes, rales.  Abdominal: Soft. BS +, no distension, tenderness, rebound or guarding.  Musculoskeletal: Normal range of motion. No edema and no tenderness.  LE: bilat/ no c/c/e, pulses 2+ bilateral. Neuro: Alert.  muscle tone coordination wnl. No cranial nerve  deficit grossly. Skin: Skin is warm and dry. No rash noted. Not diaphoretic. No erythema. No pallor. Psychiatric: Normal mood and affect. Behavior, judgment, thought content normal.  Lab Results  Component Value Date   WBC 6.2 10/29/2015   HGB 13.5 10/29/2015   HCT 39.8 10/29/2015   MCV 89.0 10/29/2015   PLT 264 10/29/2015   Lab Results  Component Value Date   CREATININE 0.63 10/29/2015   BUN 9 10/29/2015   NA 138 10/29/2015   K 4.0 10/29/2015   CL 106 10/29/2015   CO2 25 10/29/2015    Lab Results  Component Value Date   HGBA1C 5.1 12/01/2015    Lipid Panel  No results found for: CHOL, TRIG, HDL, CHOLHDL, VLDL, LDLCALC     Depression screen [facility] 2/9 12/01/2015 11/05/2015  Decreased Interest (No Data) 0  Down, Depressed, Hopeless (No Data) 3  PHQ - 2 Score - 3  Altered sleeping (No Data) 2  Tired, decreased energy 0 3  Change in appetite 3 3  Feeling bad or failure about yourself  (No Data) 3  Trouble concentrating (No Data) 2  Moving slowly or fidgety/restless 2 3  Suicidal thoughts (No Data) 3  PHQ-9 Score - 22   abd  [place] +/21/17 MPRESSION: Gallbladder is mildly contracted with sludge possibly indicating decreased motility. No changes to suggest acute cholecystitis.   Assessment and plan:   1. Loss of weight Etiology unclear, perhaps healthier diet now off fast foods, etc. - HIV antibody (with reflex) - Ova and parasite examination  - was in [place] x 2 months this summer when noted weight loss - TSH  2. Diabetes mellitus screening Given weightloss. - POCT A1C  3. Mild malnutrition (HCC) - Ova and parasite examination - Prealbumin  4. Foul smelling urine - Urinalysis Dipstick - unremarkable except concentration, recd to drink more water til urine light straw colored  5. Screen for STD (sexually transmitted disease) - Urine cytology ancillary only - dw pt safe sex practices  6. Hx of gb colic and gerd - appears better controlled now on healthier diet, encouraged. No stones noted on gb US. Trial wean ppi as able w/ good gerd prevention diet, if able stop and only take tums prn - good calcium  Return in about 3 months (around 03/02/2016).  The patient was given clear instructions to go to ER or return to medical center if symptoms don't improve, worsen or new problems develop. The patient verbalized understanding. The patient was told to call to get lab results if they haven't heard anything in the next week.    This note has been created with Education officer, environmental.  Any transcriptional errors are unintentional.   Pete Glatter, MD, MBA/MHA [facility] [place], [place] [phone]   12/01/2015, 4:45 PM

## 2015-12-01 NOTE — Progress Notes (Signed)
Pt is in the office today for establish care Pt states she is not in any pain Pt states she has been able to eat normal Pt states she she noticed she has been losing a lot of weight Pt states she was 126 when she was seen last time and now she is 120 Pt states she use to be 140 and she came back from vacation and she lost weight Pt states she finds it unusual because she doesn't know why she is losing weight

## 2015-12-02 LAB — TSH: TSH: 0.86 m[IU]/L (ref 0.50–4.30)

## 2015-12-02 LAB — HIV ANTIBODY (ROUTINE TESTING W REFLEX): HIV 1&2 Ab, 4th Generation: NONREACTIVE

## 2015-12-03 ENCOUNTER — Telehealth: Payer: Self-pay | Admitting: Internal Medicine

## 2015-12-03 LAB — URINE CYTOLOGY ANCILLARY ONLY
CHLAMYDIA, DNA PROBE: NEGATIVE
Neisseria Gonorrhea: NEGATIVE
Trichomonas: NEGATIVE

## 2015-12-03 LAB — PREALBUMIN: Prealbumin: 19 mg/dL (ref 17–34)

## 2015-12-03 NOTE — Telephone Encounter (Signed)
Patient called the office to speak with nurse regarding her lab results. Please follow up. ° °Thank you.  °

## 2015-12-04 NOTE — Telephone Encounter (Signed)
Contacted pt and made aware that we have not received her results

## 2015-12-09 NOTE — Telephone Encounter (Signed)
Patient verified DOB Patient is aware of HIV and STD being negative. Patient is aware of Thyroid function being normal. Patient is aware of labs showing no signs of malnutrition to explain weight loss. Patient expressed her understanding and had no further questions at this time.

## 2015-12-15 ENCOUNTER — Encounter: Payer: Self-pay | Admitting: *Deleted

## 2015-12-18 ENCOUNTER — Telehealth: Payer: Self-pay

## 2015-12-18 NOTE — Telephone Encounter (Signed)
[employer] Robbins[employer]orge Id: [id number] contacted pt to go over lab results pt didn't answer lvm asking pt to give me a call back at her earliest convenience

## 2016-02-29 ENCOUNTER — Ambulatory Visit: Payer: Medicaid Other | Attending: Internal Medicine | Admitting: Internal Medicine

## 2016-02-29 ENCOUNTER — Encounter: Payer: Self-pay | Admitting: Licensed Clinical Social Worker

## 2016-02-29 ENCOUNTER — Encounter: Payer: Self-pay | Admitting: Internal Medicine

## 2016-02-29 VITALS — BP 104/66 | HR 72 | Temp 98.8°F | Resp 16 | Wt 113.8 lb

## 2016-02-29 DIAGNOSIS — F32A Depression, unspecified: Secondary | ICD-10-CM

## 2016-02-29 DIAGNOSIS — N390 Urinary tract infection, site not specified: Secondary | ICD-10-CM

## 2016-02-29 DIAGNOSIS — R634 Abnormal weight loss: Secondary | ICD-10-CM | POA: Insufficient documentation

## 2016-02-29 DIAGNOSIS — F329 Major depressive disorder, single episode, unspecified: Secondary | ICD-10-CM | POA: Insufficient documentation

## 2016-02-29 DIAGNOSIS — Z681 Body mass index (BMI) 19 or less, adult: Secondary | ICD-10-CM | POA: Insufficient documentation

## 2016-02-29 DIAGNOSIS — G4700 Insomnia, unspecified: Secondary | ICD-10-CM | POA: Insufficient documentation

## 2016-02-29 DIAGNOSIS — R350 Frequency of micturition: Secondary | ICD-10-CM | POA: Diagnosis not present

## 2016-02-29 LAB — POCT URINALYSIS DIPSTICK
Bilirubin, UA: NEGATIVE
Glucose, UA: NEGATIVE
Ketones, UA: NEGATIVE
NITRITE UA: NEGATIVE
PROTEIN UA: NEGATIVE
RBC UA: NEGATIVE
SPEC GRAV UA: 1.015
UROBILINOGEN UA: 2
pH, UA: 7

## 2016-02-29 MED ORDER — CARBAMIDE PEROXIDE 6.5 % OT SOLN: 5.0000 [drp] | Freq: Two times a day (BID) | OTIC | 0 refills | Status: DC

## 2016-02-29 MED ORDER — ESCITALOPRAM OXALATE 10 MG PO TABS: 10.0000 mg | ORAL_TABLET | Freq: Every day | ORAL | 3 refills | Status: DC

## 2016-02-29 MED ORDER — SULFAMETHOXAZOLE-TRIMETHOPRIM 800-160 MG PO TABS: 1.0000 | ORAL_TABLET | Freq: Two times a day (BID) | ORAL | 0 refills | Status: DC

## 2016-02-29 MED FILL — ESCITALOPRAM 10 MG TABLET: 10 | 30 days supply | Qty: 30 | Fill #0

## 2016-02-29 MED FILL — SULFAMETHOXAZOLE/TMP DS TAB: 800-160 | 5 days supply | Qty: 10 | Fill #0

## 2016-02-29 MED FILL — EARWAX TREATMENT 6.5% DROPS: 6.5 | 15 days supply | Qty: 15 | Fill #0

## 2016-02-29 NOTE — BH Specialist Note (Signed)
Session Start time: 4:20 PM   End Time: 4:40 PM Total Time:  20 minutes Type of Service: Behavioral Health - Individual/Family Interpreter: No.   Interpreter Name & Language: N/A # [facility]   SUBJECTIVE: Gina Wheeler is a 21 y.o. female  Pt. was referred by Dr. Julien NordmannLangeland for:  anxiety and depression. Pt. reports the following symptoms/concerns: overwhelming feelings of worry, difficulty sleeping, racing thoughts, decreased appetite, and withdrawn behavior Duration of problem:  Ongoing Severity: moderately severe Previous treatment: Pt received therapy as a minor for approximately four years [facility]   OBJECTIVE: Mood: Anxious & Affect: Appropriate Risk of harm to self or others: Pt denied SI/HI Assessments administered: PHQ-9; GAD-7  LIFE CONTEXT:  Family & Social: Pt resides with family. She has friends and a boyfriend; however, reports that she does not utilize support when needed School/ Work: Pt is not employed. Reports that she plans to return to school to become CNA or Ultrasound Technician Self-Care: Pt has difficulty sleeping and a decreased appetite that has resulted in weight loss. No report of substance use Life changes: Pt's parents are separated. She reports increased stress from mother pressuring her about future employment and/or career.  What is important to pt/family (values): Family   GOALS ADDRESSED:  Decreases symptoms of depression Decrease symptoms of anxiety  INTERVENTIONS: Solution Focused, Strength-based and Supportive   ASSESSMENT:  Pt currently experiencing depression and anxiety. Pt reports overwhelming feelings of worry, difficulty sleeping, racing thoughts, decreased appetite, and withdrawn behavior. Pt has support; however, reports not utilizing family and friends as needed. Pt may benefit from psychotherapy and medication management. Pt received behavioral health services as a minor for  four years. She is interested in re-initiating services. LCSWA educated pt on the cycle of depression and anxiety and discussed healthy coping skills to decrease symptoms. Pt identified coping strategies for current stressors. LCSWA provided pt with community resources for crisis intervention, therapy, and medication management.      PLAN: 1. F/U with behavioral health clinician: Pt was encouraged to contact LCSWA if symptoms worsen or fail to improve to [facility]. 2. Behavioral Health meds: Lexapro 3. Behavioral recommendations: LCSWA recommends that pt apply healthy coping skills discussed and utilize community resources, as needed. Pt is encouraged to schedule follow up appointment with LCSWA 4. Referral: Brief Counseling/Psychotherapy, [facility], Problem-solving teaching/coping strategies, Psychoeducation and Supportive Counseling 5. From scale of 1-10, how likely are you to follow plan: 6/10   Bridgett LarssonJasmine D Lewis, MSW, LCSWA  Clinical Social Worker 03/01/16 2:10 PM  Warmhandoff:   Warm Hand Off Completed.

## 2016-02-29 NOTE — Patient Instructions (Addendum)
High-Protein and High-Calorie Diet Introduction Eating high-protein and high-calorie foods can help you to gain weight, heal after an injury, and recover after an illness or surgery. What is my plan? The specific amount of daily protein and calories you need depends on:  Your body weight.  The reason this diet is recommended for you. Generally, a high-protein, high-calorie diet involves:  Eating 250-500 extra calories each day.  Making sure that 10-35% of your daily calories come from protein. Talk to your health care provider about how much protein and how many calories you need each day. Follow the diet as directed by your health care provider. What do I need to know about this diet?  Ask your health care provider if you should take a nutritional supplement.  Try to eat six small meals each day instead of three large meals.  Eat a balanced diet, including one food that is high in protein at each meal.  Keep nutritious snacks handy, such as nuts, trail mixes, dried fruit, and yogurt.  If you have kidney disease or diabetes, eating too much protein may put extra stress on your kidneys. Talk to your health care provider if you have either of those conditions. What are some high-protein foods? Grains  Quinoa. Bulgur wheat. Vegetables  Soybeans. Peas. Meats and Other Protein Sources  Beef, pork, and poultry. Fish and seafood. Eggs. Tofu. Textured vegetable protein (TVP). Peanut butter. Nuts and seeds. Dried beans. Protein powders. Dairy  Whole milk. Whole-milk yogurt. Powdered milk. Cheese. Cottage Cheese. Eggnog. Beverages  High-protein supplement drinks. Soy milk. Other  Protein bars. The items listed above may not be a complete list of recommended foods or beverages. Contact your dietitian for more options.  What are some high-calorie foods? Grains  Pasta. Quick breads. Muffins. Pancakes. Ready-to-eat cereal. Vegetables  Vegetables cooked in oil or butter. Fried  potatoes. Fruits  Dried fruit. Fruit leather. Canned fruit in syrup. Fruit juice. Avocados. Meats and Other Protein Sources  Peanut butter. Nuts and seeds. Dairy  Heavy cream. Whipped cream. Cream cheese. Sour cream. Ice cream. Custard. Pudding. Beverages  Meal-replacement beverages. Nutrition shakes. Fruit juice. Sugar-sweetened soft drinks. Condiments  Salad dressing. Mayonnaise. Alfredo sauce. Fruit preserves or jelly. Honey. Syrup. Sweets/Desserts  Cake. Cookies. Pie. Pastries. Candy bars. Chocolate. Fats and Oils  Butter or margarine. Oil. Gravy. Other  Meal-replacement bars. The items listed above may not be a complete list of recommended foods or beverages. Contact your dietitian for more options.  What are some tips for including high-protein and high-calorie foods in my diet?  Add whole milk, half-and-half, or heavy cream to cereal, pudding, soup, or hot cocoa.  Add whole milk to instant breakfast drinks.  Add peanut butter to oatmeal or smoothies.  Add powdered milk to baked goods, smoothies, or milkshakes.  Add powdered milk, cream, or butter to mashed potatoes.  Add cheese to cooked vegetables.  Make whole-milk yogurt parfaits. Top them with granola, fruit, or nuts.  Add cottage cheese to your fruit.  Add avocados, cheese, or both to sandwiches or salads.  Add meat, poultry, or seafood to rice, pasta, casseroles, salads, and soups.  Use mayonnaise when making egg salad, chicken salad, or tuna salad.  Use peanut butter as a topping for pretzels, celery, or crackers.  Add beans to casseroles, dips, and spreads.  Add pureed beans to sauces and soups.  Replace calorie-free drinks with calorie-containing drinks, such as milk and fruit juice. This information is not intended to replace advice given to   you by your health care provider. Make sure you discuss any questions you have with your health care provider. Document Released: 01/24/2005 Document Revised:  07/02/2015 Document Reviewed: 07/09/2013  2017 Elsevier  - Insomnia Insomnia is a sleep disorder that makes it difficult to fall asleep or to stay asleep. Insomnia can cause tiredness (fatigue), low energy, difficulty concentrating, mood swings, and poor performance at work or school. There are three different ways to classify insomnia:  Difficulty falling asleep.  Difficulty staying asleep.  Waking up too early in the morning. Any type of insomnia can be long-term (chronic) or short-term (acute). Both are common. Short-term insomnia usually lasts for three months or less. Chronic insomnia occurs at least three times a week for longer than three months. What are the causes? Insomnia may be caused by another condition, situation, or substance, such as:  Anxiety.  Certain medicines.  Gastroesophageal reflux disease (GERD) or other gastrointestinal conditions.  Asthma or other breathing conditions.  Restless legs syndrome, sleep apnea, or other sleep disorders.  Chronic pain.  Menopause. This may include hot flashes.  Stroke.  Abuse of alcohol, tobacco, or illegal drugs.  Depression.  Caffeine.  Neurological disorders, such as Alzheimer disease.  An overactive thyroid (hyperthyroidism). The cause of insomnia may not be known. What increases the risk? Risk factors for insomnia include:  Gender. Women are more commonly affected than men.  Age. Insomnia is more common as you get older.  Stress. This may involve your professional or personal life.  Income. Insomnia is more common in people with lower income.  Lack of exercise.  Irregular work schedule or night shifts.  Traveling between different time zones. What are the signs or symptoms? If you have insomnia, trouble falling asleep or trouble staying asleep is the main symptom. This may lead to other symptoms, such as:  Feeling fatigued.  Feeling nervous about going to sleep.  Not feeling rested in the  morning.  Having trouble concentrating.  Feeling irritable, anxious, or depressed. How is this treated? Treatment for insomnia depends on the cause. If your insomnia is caused by an underlying condition, treatment will focus on addressing the condition. Treatment may also include:  Medicines to help you sleep.  Counseling or therapy.  Lifestyle adjustments. Follow these instructions at home:  Take medicines only as directed by your health care provider.  Keep regular sleeping and waking hours. Avoid naps.  Keep a sleep diary to help you and your health care provider figure out what could be causing your insomnia. Include:  When you sleep.  When you wake up during the night.  How well you sleep.  How rested you feel the next day.  Any side effects of medicines you are taking.  What you eat and drink.  Make your bedroom a comfortable place where it is easy to fall asleep:  Put up shades or special blackout curtains to block light from outside.  Use a white noise machine to block noise.  Keep the temperature cool.  Exercise regularly as directed by your health care provider. Avoid exercising right before bedtime.  Use relaxation techniques to manage stress. Ask your health care provider to suggest some techniques that may work well for you. These may include:  Breathing exercises.  Routines to release muscle tension.  Visualizing peaceful scenes.  Cut back on alcohol, caffeinated beverages, and cigarettes, especially close to bedtime. These can disrupt your sleep.  Do not overeat or eat spicy foods right before bedtime. This can lead  to digestive discomfort that can make it hard for you to sleep.  Limit screen use before bedtime. This includes:  Watching TV.  Using your smartphone, tablet, and computer.  Stick to a routine. This can help you fall asleep faster. Try to do a quiet activity, brush your teeth, and go to bed at the same time each night.  Get out  of bed if you are still awake after 15 minutes of trying to sleep. Keep the lights down, but try reading or doing a quiet activity. When you feel sleepy, go back to bed.  Make sure that you drive carefully. Avoid driving if you feel very sleepy.  Keep all follow-up appointments as directed by your health care provider. This is important. Contact a health care provider if:  You are tired throughout the day or have trouble in your daily routine due to sleepiness.  You continue to have sleep problems or your sleep problems get worse. Get help right away if:  You have serious thoughts about hurting yourself or someone else. This information is not intended to replace advice given to you by your health care provider. Make sure you discuss any questions you have with your health care provider. Document Released: 01/22/2000 Document Revised: 06/26/2015 Document Reviewed: 10/25/2013 Elsevier Interactive Patient Education  2017 Elsevier Inc.   -  Major Depressive Disorder, Adult Major depressive disorder (MDD) is a mental health condition. MDD often makes you feel sad, hopeless, or helpless. MDD can also cause symptoms in your body. MDD can affect your:  Work.  School.  Relationships.  Other normal activities. MDD can range from mild to very bad. It may occur once (single episode MDD). It can also occur many times (recurrent MDD). The main symptoms of MDD often include:  Feeling sad, depressed, or irritable most of the time.  Loss of interest. MDD symptoms also include:  Sleeping too much or too little.  Eating too much or too little.  A change in your weight.  Feeling tired (fatigue) or having low energy.  Feeling worthless.  Feeling guilty.  Trouble making decisions.  Trouble thinking clearly.  Thoughts of suicide or harming others.  Feeling weak.  Feeling agitated.  Keeping yourself from being around other people (isolation). Follow these instructions at  home: Activity  Do these things as told by your doctor:  Go back to your normal activities.  Exercise regularly.  Spend time outdoors. Alcohol  Talk with your doctor about how alcohol can affect your antidepressant medicines.  Do not drink alcohol. Or, limit how much alcohol you drink.  This means no more than 1 drink a day for nonpregnant women and 2 drinks a day for men. One drink equals one of these:  12 oz of beer.  5 oz of wine.  1 oz of hard liquor. General instructions  Take over-the-counter and prescription medicines only as told by your doctor.  Eat a healthy diet.  Get plenty of sleep.  Find activities that you enjoy. Make time to do them.  Think about joining a support group. Your doctor may be able to suggest a group for you.  Keep all follow-up visits as told by your doctor. This is important. Where to find more information:  [employer] on Mental Illness:  www.nami.org  [place] [employer] of Mental Health:  http://www.maynard.net/  National Suicide Prevention Lifeline:  915-066-3502. This is free, 24-hour help. Contact a doctor if:  Your symptoms get worse.  You have new symptoms. Get help  right away if:  You self-harm.  You see, hear, taste, smell, or feel things that are not present (hallucinate). If you ever feel like you may hurt yourself or others, or have thoughts about taking your own life, get help right away. You can go to your nearest emergency department or call:  Your local emergency services (911 in the [place]  A suicide crisis helpline, such as the National Suicide Prevention Lifeline:  (716)726-8809. This is open 24 hours a day. This information is not intended to replace advice given to you by your health care provider. Make sure you discuss any questions you have with your health care provider. Document Released: 01/05/2015 Document Revised: 10/11/2015 Document Reviewed: 10/11/2015 Elsevier Interactive Patient  Education  2017 [employer].

## 2016-02-29 NOTE — Progress Notes (Signed)
Gina Wheeler, is a 21 y.o. female  ZOX:[id number]  [id number]  DOB - 1995/08/06  Chief Complaint  Patient presents with  . Follow-up        Subjective:   Gina Wheeler is a 21 y.o. female here today for a follow up visit, last seen 12/01/15, w/ hx of weightloss.  She has since actually lost more weight since I last saw. She weighed 120 on 10/24, now weighs 113.  She has not eaten anything all day.   Of note, pt states that she probably eats once a day. She knows she is losing weight, but does not know how to fix it. She tries to eat more protein.  She does not have a scale at home. Does note that in past, she use to spit out her food. Describes herself as "germaphobe" and would spit food out if she thought someone else had touched the food, etc. She denies exercising extensively. She does squats though.  She does c/o of depression, when she was younger she use to see a therapist for 4 years. She does get crying spells recently, but denies si/hi/avh.  Interested in starting something.   She also does not sleep well, up all night and does not get to bed til 7am. She watches videos on her phone. Does not watch tv much, denies caffeine.  Patient has No headache, No chest pain, No abdominal pain - No Nausea, No new weakness tingling or numbness, No Cough - SOB.  No problems updated.  ALLERGIES: No Known Allergies  PAST MEDICAL HISTORY: No past medical history on file.  MEDICATIONS AT HOME: Prior to Admission medications   Medication Sig Start Date End Date Taking? Authorizing Provider  carbamide peroxide (DEBROX) 6.5 % otic solution Place 5 drops into the right ear 2 (two) times daily. 02/29/16   Pete Glatter, MD  escitalopram (LEXAPRO) 10 MG tablet Take 1 tablet (10 mg total) by mouth at bedtime. Take 1/2 pill x 4 days, than take 1 tab daily. 02/29/16   Pete Glatter, MD  naproxen (NAPROSYN) 250 MG tablet Take 1 tablet (250 mg total) by mouth 2  (two) times daily with a meal. As needed for pain Patient not taking: Reported on 02/29/2016 10/29/15   Everlene Farrier, PA-C  omeprazole (PRILOSEC) 20 MG capsule Take 1 capsule (20 mg total) by mouth daily. Patient not taking: Reported on 02/29/2016 11/05/15   Anders Simmonds, PA-C  ondansetron (ZOFRAN ODT) 4 MG disintegrating tablet Take 1 tablet (4 mg total) by mouth every 8 (eight) hours as needed for nausea or vomiting. Patient not taking: Reported on 12/01/2015 10/29/15   Everlene Farrier, PA-C     Objective:   Vitals:   02/29/16 1535  BP: 104/66  Pulse: 72  Resp: 16  Temp: 98.8 F (37.1 C)  TempSrc: Oral  SpO2: 97%  Weight: 113 lb 12.8 oz (51.6 kg)    Exam General appearance : Awake, alert, not in any distress. Speech Clear. Not toxic looking, thin appearing. Pleasant. Good eye contact. HEENT: Atraumatic and Normocephalic, pupils equally reactive to light. Neck: supple, no JVD. Chest:Good air entry bilaterally, no added sounds. CVS: S1 S2 regular, no murmurs/gallups or rubs. Abdomen: Bowel sounds active, Non tender and not distended with no gaurding, rigidity or rebound. Extremities: B/L Lower Ext shows no edema, both legs are warm to touch Neurology: Awake alert, and oriented X 3, CN II-XII grossly intact, Non focal Skin:No Rash  Data Review Lab Results  Component  Value Date   HGBA1C 5.1 12/01/2015    [facility] 2/9 02/29/2016 12/01/2015 11/05/2015  Decreased Interest 1 (No Data) 0  Down, Depressed, Hopeless 3 (No Data) 3  PHQ - 2 Score 4 - 3  Altered sleeping 3 (No Data) 2  Tired, decreased energy 3 0 3  Change in appetite 1 3 3   Feeling bad or failure about yourself  3 (No Data) 3  Trouble concentrating 1 (No Data) 2  Moving slowly or fidgety/restless 3 2 3   Suicidal thoughts 0 (No Data) 3  PHQ-9 Score 18 - 22      Assessment & Plan   1. Loss of weight Suspect due to depression, but given her hx of spitting out food, concerned for component of  anorexia/bolemia as well.  Would not recd pt get a scale, because would make this worse.  recd high protein high fat diet. Protein shakes if does not have time to eat. Encouraged more frequent meals rather than 1 meal daily   2. Insomnia, unspecified type Sleep hygeine discussed. Info provided, depression may be making it worse.  3. Depression No si/hi/avh Will start lexapro 10 qday. Fu 1 month to eval Asked Jasmine sw to talk to pt as well to provide available resources.  4. Frequent urination,  - Urinalysis Dipstick + for uti - bactrim ds bid x 5 days     Patient have been counseled extensively about nutrition and exercise  Return in about 4 weeks (around 03/28/2016) for depression.  /weight loss.  The patient was given clear instructions to go to ER or return to medical center if symptoms don't improve, worsen or new problems develop. The patient verbalized understanding. The patient was told to call to get lab results if they haven't heard anything in the next week.   This note has been created with Education officer, environmentalDragon speech recognition software and smart phrase technology. Any transcriptional errors are unintentional.   Pete Glatterawn T Langeland, MD, MBA/MHA [facility] [place], [place] [phone]   02/29/2016, 4:16 PM

## 2016-04-13 ENCOUNTER — Encounter: Payer: Self-pay | Admitting: Internal Medicine

## 2016-04-13 ENCOUNTER — Ambulatory Visit: Payer: Medicaid Other | Attending: Internal Medicine | Admitting: Internal Medicine

## 2016-04-13 VITALS — BP 103/74 | HR 82 | Temp 98.9°F | Resp 16 | Wt 113.6 lb

## 2016-04-13 DIAGNOSIS — Z79899 Other long term (current) drug therapy: Secondary | ICD-10-CM | POA: Insufficient documentation

## 2016-04-13 DIAGNOSIS — G4700 Insomnia, unspecified: Secondary | ICD-10-CM | POA: Diagnosis not present

## 2016-04-13 DIAGNOSIS — R634 Abnormal weight loss: Secondary | ICD-10-CM | POA: Diagnosis not present

## 2016-04-13 DIAGNOSIS — F339 Major depressive disorder, recurrent, unspecified: Secondary | ICD-10-CM | POA: Insufficient documentation

## 2016-04-13 DIAGNOSIS — R300 Dysuria: Secondary | ICD-10-CM

## 2016-04-13 DIAGNOSIS — Z681 Body mass index (BMI) 19 or less, adult: Secondary | ICD-10-CM | POA: Insufficient documentation

## 2016-04-13 DIAGNOSIS — Z888 Allergy status to other drugs, medicaments and biological substances status: Secondary | ICD-10-CM | POA: Diagnosis not present

## 2016-04-13 DIAGNOSIS — F329 Major depressive disorder, single episode, unspecified: Secondary | ICD-10-CM | POA: Diagnosis present

## 2016-04-13 LAB — CMP AND LIVER
ALBUMIN: 4 g/dL (ref 3.6–5.1)
ALK PHOS: 98 U/L (ref 33–115)
ALT: 7 U/L (ref 6–29)
AST: 10 U/L (ref 10–30)
BILIRUBIN DIRECT: 0.1 mg/dL (ref <=0.2)
BUN: 12 mg/dL (ref 7–25)
CALCIUM: 9.4 mg/dL (ref 8.6–10.2)
CO2: 26 mmol/L (ref 20–31)
CREATININE: 0.65 mg/dL (ref 0.50–1.10)
Chloride: 104 mmol/L (ref 98–110)
Glucose, Bld: 90 mg/dL (ref 65–99)
Indirect Bilirubin: 0.5 mg/dL (ref 0.2–1.2)
POTASSIUM: 4 mmol/L (ref 3.5–5.3)
Sodium: 141 mmol/L (ref 135–146)
Total Bilirubin: 0.6 mg/dL (ref 0.2–1.2)
Total Protein: 7.2 g/dL (ref 6.1–8.1)

## 2016-04-13 LAB — CBC WITH DIFFERENTIAL/PLATELET
Basophils Absolute: 61 cells/uL (ref 0–200)
Basophils Relative: 1 %
EOS ABS: 854 {cells}/uL — AB (ref 15–500)
Eosinophils Relative: 14 %
HEMATOCRIT: 39 % (ref 35.0–45.0)
HEMOGLOBIN: 12.7 g/dL (ref 11.7–15.5)
LYMPHS ABS: 1647 {cells}/uL (ref 850–3900)
LYMPHS PCT: 27 %
MCH: 28.8 pg (ref 27.0–33.0)
MCHC: 32.6 g/dL (ref 32.0–36.0)
MCV: 88.4 fL (ref 80.0–100.0)
MONO ABS: 366 {cells}/uL (ref 200–950)
MPV: 8.9 fL (ref 7.5–12.5)
Monocytes Relative: 6 %
NEUTROS PCT: 52 %
Neutro Abs: 3172 cells/uL (ref 1500–7800)
Platelets: 337 10*3/uL (ref 140–400)
RBC: 4.41 MIL/uL (ref 3.80–5.10)
RDW: 13.1 % (ref 11.0–15.0)
WBC: 6.1 10*3/uL (ref 3.8–10.8)

## 2016-04-13 LAB — POCT URINALYSIS DIPSTICK
GLUCOSE UA: NEGATIVE
KETONES UA: NEGATIVE
Nitrite, UA: NEGATIVE
Protein, UA: 30
RBC UA: NEGATIVE
SPEC GRAV UA: 1.025
UROBILINOGEN UA: 1
pH, UA: 6

## 2016-04-13 LAB — POCT URINE PREGNANCY: Preg Test, Ur: NEGATIVE

## 2016-04-13 MED ORDER — AMITRIPTYLINE HCL 10 MG PO TABS: 10.0000 mg | ORAL_TABLET | Freq: Every day | ORAL | 1 refills | Status: DC

## 2016-04-13 NOTE — Progress Notes (Signed)
Gina Wheeler, is a 21 y.o. female  ZOX:[id number]  [id number]  DOB - [birth year]  Chief Complaint  Patient presents with  . Follow-up    depression and weight loss  . URI  . Back Pain  . Headache        Subjective:   Gina Wheeler is a 21 y.o. female here today for a follow up visit for depression and weightloss. Pt states she tried taking lexapro 2x, but had n/v both times. Since than, she is doing bit better, eating more fuller meals. She denies si/hi/avh, but still having a hard time getting and staying asleep.  Denies any f/c/n/v/nasal congestion currently, but tired. Easily fatigued. Her brother has viral syndrome currently, but not mono.  Patient has No headache, No chest pain, No abdominal pain - No Nausea, No new weakness tingling or numbness, No Cough - SOB.  Denies dysuria,  Stated her recent period only lasted about 3 days, typically last 7 days.  No problems updated.  ALLERGIES: Allergies  Allergen Reactions  . Lexapro [Escitalopram Oxalate] Nausea And Vomiting    PAST MEDICAL HISTORY: No past medical history on file.  MEDICATIONS AT HOME: Prior to Admission medications   Medication Sig Start Date End Date Taking? Authorizing Provider  amitriptyline (ELAVIL) 10 MG tablet Take 1 tablet (10 mg total) by mouth at bedtime. 04/13/16   Pete Glatter, MD  carbamide peroxide (DEBROX) 6.5 % otic solution Place 5 drops into the right ear 2 (two) times daily. Patient not taking: Reported on 04/13/2016 02/29/16   Pete Glatter, MD  naproxen (NAPROSYN) 250 MG tablet Take 1 tablet (250 mg total) by mouth 2 (two) times daily with a meal. As needed for pain Patient not taking: Reported on 02/29/2016 10/29/15   Everlene Farrier, PA-C  omeprazole (PRILOSEC) 20 MG capsule Take 1 capsule (20 mg total) by mouth daily. Patient not taking: Reported on 02/29/2016 11/05/15   Anders Simmonds, PA-C  ondansetron (ZOFRAN ODT) 4 MG disintegrating tablet Take 1  tablet (4 mg total) by mouth every 8 (eight) hours as needed for nausea or vomiting. Patient not taking: Reported on 12/01/2015 10/29/15   Everlene Farrier, PA-C  sulfamethoxazole-trimethoprim (BACTRIM DS,SEPTRA DS) 800-160 MG tablet Take 1 tablet by mouth 2 (two) times daily. 02/29/16   Pete Glatter, MD     Objective:   Vitals:   04/13/16 1139  BP: 103/74  Pulse: 82  Resp: 16  Temp: 98.9 F (37.2 C)  TempSrc: Oral  SpO2: 100%  Weight: 113 lb 9.6 oz (51.5 kg)    Exam General appearance : Awake, alert, not in any distress. Speech Clear. Not toxic looking, tired, thin appearing. HEENT: Atraumatic and Normocephalic, pupils equally reactive to light. Neck: supple, no JVD.  Chest:Good air entry bilaterally, no added sounds. CVS: S1 S2 regular, no murmurs/gallups or rubs. Abdomen: Bowel sounds active, Non tender and not distended with no gaurding, rigidity or rebound. Extremities: B/L Lower Ext shows no edema, both legs are warm to touch Neurology: Awake alert, and oriented X 3, CN II-XII grossly intact, Non focal Skin:No Rash  Data Review Lab Results  Component Value Date   HGBA1C 5.1 12/01/2015    Depression screen [facility] 2/9 02/29/2016 12/01/2015 11/05/2015  Decreased Interest 1 (No Data) 0  Down, Depressed, Hopeless 3 (No Data) 3  PHQ - 2 Score 4 - 3  Altered sleeping 3 (No Data) 2  Tired, decreased energy 3 0 3  Change in appetite 1 3  3  Feeling bad or failure about yourself  3 (No Data) 3  Trouble concentrating 1 (No Data) 2  Moving slowly or fidgety/restless 3 2 3   Suicidal thoughts 0 (No Data) 3  PHQ-9 Score 18 - 22      Assessment & Plan   1. Depression, recurrent (HCC) Failed lexapro due to n/v, will trial elavil 10mg  qhs, may help w/ both depression and insomnia  2. Weight loss Suspect due to #1, pt denies active exercising/anorexia/bolemia risk - encouraged increase protein intake, coupons for protein shakes provided - CBC with Differential - CMP and  Liver - currently weight remains stable, slightly higher than last.  3. Short menses, no dysuria - suspect due to her slight weight/malnutrition. - POCT urine pregnancy - neg - POCT urinalysis dipstick - neg  4. Insomnia  - sleep hygiene discussed - trial elavil 10mg  qhs, if oversedated in am, can cut pill in half and try this.   Patient have been counseled extensively about nutrition and exercise  Return in about 4 weeks (around 05/11/2016) for depression.  The patient was given clear instructions to go to ER or return to medical center if symptoms don't improve, worsen or new problems develop. The patient verbalized understanding. The patient was told to call to get lab results if they haven't heard anything in the next week.   This note has been created with Education officer, environmentalDragon speech recognition software and smart phrase technology. Any transcriptional errors are unintentional.   Pete Glatterawn T Kadeidra Coryell, MD, MBA/MHA [facility] and [facility] [place], [place] [phone]   04/13/2016, 12:00 PM

## 2016-04-13 NOTE — Patient Instructions (Addendum)
High-Protein and High-Calorie Diet Eating high-protein and high-calorie foods can help you to gain weight, heal after an injury, and recover after an illness or surgery. What is my plan? The specific amount of daily protein and calories you need depends on:  Your body weight.  The reason this diet is recommended for you. Generally, a high-protein, high-calorie diet involves:  Eating 250-500 extra calories each day.  Making sure that 10-35% of your daily calories come from protein. Talk to your health care provider about how much protein and how many calories you need each day. Follow the diet as directed by your health care provider. What do I need to know about this diet?  Ask your health care provider if you should take a nutritional supplement.  Try to eat six small meals each day instead of three large meals.  Eat a balanced diet, including one food that is high in protein at each meal.  Keep nutritious snacks handy, such as nuts, trail mixes, dried fruit, and yogurt.  If you have kidney disease or diabetes, eating too much protein may put extra stress on your kidneys. Talk to your health care provider if you have either of those conditions. What are some high-protein foods? Grains  Quinoa. Bulgur wheat. Vegetables  Soybeans. Peas. Meats and Other Protein Sources  Beef, pork, and poultry. Fish and seafood. Eggs. Tofu. Textured vegetable protein (TVP). Peanut butter. Nuts and seeds. Dried beans. Protein powders. Dairy  Whole milk. Whole-milk yogurt. Powdered milk. Cheese. Cottage Cheese. Eggnog. Beverages  High-protein supplement drinks. Soy milk. Other  Protein bars. The items listed above may not be a complete list of recommended foods or beverages. Contact your dietitian for more options.  What are some high-calorie foods? Grains  Pasta. Quick breads. Muffins. Pancakes. Ready-to-eat cereal. Vegetables  Vegetables cooked in oil or butter. Fried potatoes. Fruits   Dried fruit. Fruit leather. Canned fruit in syrup. Fruit juice. Avocados. Meats and Other Protein Sources  Peanut butter. Nuts and seeds. Dairy  Heavy cream. Whipped cream. Cream cheese. Sour cream. Ice cream. Custard. Pudding. Beverages  Meal-replacement beverages. Nutrition shakes. Fruit juice. Sugar-sweetened soft drinks. Condiments  Salad dressing. Mayonnaise. Alfredo sauce. Fruit preserves or jelly. Honey. Syrup. Sweets/Desserts  Cake. Cookies. Pie. Pastries. Candy bars. Chocolate. Fats and Oils  Butter or margarine. Oil. Gravy. Other  Meal-replacement bars. The items listed above may not be a complete list of recommended foods or beverages. Contact your dietitian for more options.  What are some tips for including high-protein and high-calorie foods in my diet?  Add whole milk, half-and-half, or heavy cream to cereal, pudding, soup, or hot cocoa.  Add whole milk to instant breakfast drinks.  Add peanut butter to oatmeal or smoothies.  Add powdered milk to baked goods, smoothies, or milkshakes.  Add powdered milk, cream, or butter to mashed potatoes.  Add cheese to cooked vegetables.  Make whole-milk yogurt parfaits. Top them with granola, fruit, or nuts.  Add cottage cheese to your fruit.  Add avocados, cheese, or both to sandwiches or salads.  Add meat, poultry, or seafood to rice, pasta, casseroles, salads, and soups.  Use mayonnaise when making egg salad, chicken salad, or tuna salad.  Use peanut butter as a topping for pretzels, celery, or crackers.  Add beans to casseroles, dips, and spreads.  Add pureed beans to sauces and soups.  Replace calorie-free drinks with calorie-containing drinks, such as milk and fruit juice. This information is not intended to replace advice given to you   by your health care provider. Make sure you discuss any questions you have with your health care provider. Document Released: 01/24/2005 Document Revised: 07/02/2015 Document  Reviewed: 07/09/2013 Elsevier Interactive Patient Education  2017 [employer]vier Inc.  - Insomnia Insomnia is a sleep disorder that makes it difficult to fall asleep or to stay asleep. Insomnia can cause tiredness (fatigue), low energy, difficulty concentrating, mood swings, and poor performance at work or school. There are three different ways to classify insomnia:  Difficulty falling asleep.  Difficulty staying asleep.  Waking up too early in the morning. Any type of insomnia can be long-term (chronic) or short-term (acute). Both are common. Short-term insomnia usually lasts for three months or less. Chronic insomnia occurs at least three times a week for longer than three months. What are the causes? Insomnia may be caused by another condition, situation, or substance, such as:  Anxiety.  Certain medicines.  Gastroesophageal reflux disease (GERD) or other gastrointestinal conditions.  Asthma or other breathing conditions.  Restless legs syndrome, sleep apnea, or other sleep disorders.  Chronic pain.  Menopause. This may include hot flashes.  Stroke.  Abuse of alcohol, tobacco, or illegal drugs.  Depression.  Caffeine.  Neurological disorders, such as Alzheimer disease.  An overactive thyroid (hyperthyroidism). The cause of insomnia may not be known. What increases the risk? Risk factors for insomnia include:  Gender. Women are more commonly affected than men.  Age. Insomnia is more common as you get older.  Stress. This may involve your professional or personal life.  Income. Insomnia is more common in people with lower income.  Lack of exercise.  Irregular work schedule or night shifts.  Traveling between different time zones. What are the signs or symptoms? If you have insomnia, trouble falling asleep or trouble staying asleep is the main symptom. This may lead to other symptoms, such as:  Feeling fatigued.  Feeling nervous about going to sleep.  Not  feeling rested in the morning.  Having trouble concentrating.  Feeling irritable, anxious, or depressed. How is this treated? Treatment for insomnia depends on the cause. If your insomnia is caused by an underlying condition, treatment will focus on addressing the condition. Treatment may also include:  Medicines to help you sleep.  Counseling or therapy.  Lifestyle adjustments. Follow these instructions at home:  Take medicines only as directed by your health care provider.  Keep regular sleeping and waking hours. Avoid naps.  Keep a sleep diary to help you and your health care provider figure out what could be causing your insomnia. Include:  When you sleep.  When you wake up during the night.  How well you sleep.  How rested you feel the next day.  Any side effects of medicines you are taking.  What you eat and drink.  Make your bedroom a comfortable place where it is easy to fall asleep:  Put up shades or special blackout curtains to block light from outside.  Use a white noise machine to block noise.  Keep the temperature cool.  Exercise regularly as directed by your health care provider. Avoid exercising right before bedtime.  Use relaxation techniques to manage stress. Ask your health care provider to suggest some techniques that may work well for you. These may include:  Breathing exercises.  Routines to release muscle tension.  Visualizing peaceful scenes.  Cut back on alcohol, caffeinated beverages, and cigarettes, especially close to bedtime. These can disrupt your sleep.  Do not overeat or eat spicy foods right before  bedtime. This can lead to digestive discomfort that can make it hard for you to sleep.  Limit screen use before bedtime. This includes:  Watching TV.  Using your smartphone, tablet, and computer.  Stick to a routine. This can help you fall asleep faster. Try to do a quiet activity, brush your teeth, and go to bed at the same time  each night.  Get out of bed if you are still awake after 15 minutes of trying to sleep. Keep the lights down, but try reading or doing a quiet activity. When you feel sleepy, go back to bed.  Make sure that you drive carefully. Avoid driving if you feel very sleepy.  Keep all follow-up appointments as directed by your health care provider. This is important. Contact a health care provider if:  You are tired throughout the day or have trouble in your daily routine due to sleepiness.  You continue to have sleep problems or your sleep problems get worse. Get help right away if:  You have serious thoughts about hurting yourself or someone else. This information is not intended to replace advice given to you by your health care provider. Make sure you discuss any questions you have with your health care provider. Document Released: 01/22/2000 Document Revised: 06/26/2015 Document Reviewed: 10/25/2013 Elsevier Interactive Patient Education  2017 [employer]

## 2016-04-26 ENCOUNTER — Telehealth: Payer: Self-pay

## 2016-04-26 NOTE — Telephone Encounter (Signed)
-----   Message from Pete Glatterawn T Langeland, MD sent at 04/16/2016  9:02 AM EST ----- All her labs are normal. Continue trying to increase her protein intake as able. thanks

## 2016-04-26 NOTE — Telephone Encounter (Signed)
CMA call to inform patient lab results  CMA spoke with patient mother  Mother was aware and understood

## 2016-05-13 ENCOUNTER — Ambulatory Visit: Payer: Medicaid Other | Attending: Internal Medicine | Admitting: *Deleted

## 2016-05-13 ENCOUNTER — Encounter: Payer: Self-pay | Admitting: *Deleted

## 2016-05-13 VITALS — BP 114/65 | HR 99 | Temp 98.2°F | Resp 16 | Wt 116.4 lb

## 2016-05-13 DIAGNOSIS — N926 Irregular menstruation, unspecified: Secondary | ICD-10-CM | POA: Diagnosis not present

## 2016-05-13 LAB — POCT URINE PREGNANCY: Preg Test, Ur: NEGATIVE

## 2016-05-13 NOTE — Progress Notes (Signed)
Pt alert and oriented. NAD  Reason for walk-in: abdominal bloating and cramping. Headaches for 3-4 days. Late or missed cycle. LMP: 04/05/2016. She has also noticed thick white discharge. Denies itching or dysuria, vomiting, or fever.  Urine pregnancy test was negative. Apt. scheduled for patient to come in on Monday at 3:30 with PCP.

## 2016-05-16 ENCOUNTER — Ambulatory Visit: Payer: Medicaid Other | Attending: Internal Medicine | Admitting: Internal Medicine

## 2016-05-16 ENCOUNTER — Other Ambulatory Visit (HOSPITAL_COMMUNITY)
Admission: RE | Admit: 2016-05-16 | Discharge: 2016-05-16 | Disposition: A | Discharge status: Home / Self Care | Payer: Medicaid Other | Source: Ambulatory Visit | Attending: Internal Medicine | Admitting: Internal Medicine

## 2016-05-16 ENCOUNTER — Encounter: Payer: Self-pay | Admitting: Internal Medicine

## 2016-05-16 VITALS — BP 124/78 | HR 137 | Temp 98.4°F | Resp 16 | Wt 113.6 lb

## 2016-05-16 DIAGNOSIS — N898 Other specified noninflammatory disorders of vagina: Secondary | ICD-10-CM | POA: Diagnosis present

## 2016-05-16 DIAGNOSIS — Z888 Allergy status to other drugs, medicaments and biological substances status: Secondary | ICD-10-CM | POA: Insufficient documentation

## 2016-05-16 DIAGNOSIS — Z124 Encounter for screening for malignant neoplasm of cervix: Secondary | ICD-10-CM

## 2016-05-16 DIAGNOSIS — Z79899 Other long term (current) drug therapy: Secondary | ICD-10-CM | POA: Diagnosis not present

## 2016-05-16 LAB — POCT URINALYSIS DIPSTICK
Glucose, UA: NEGATIVE
KETONES UA: 40
Nitrite, UA: NEGATIVE
PH UA: 6 (ref 5.0–8.0)
Protein, UA: 100
RBC UA: NEGATIVE
SPEC GRAV UA: 1.02 (ref 1.030–1.035)
Urobilinogen, UA: 2 — AB (ref ?–2.0)

## 2016-05-16 LAB — POCT URINE PREGNANCY: Preg Test, Ur: NEGATIVE

## 2016-05-16 MED ORDER — NORGESTIM-ETH ESTRAD TRIPHASIC 0.18/0.215/0.25 MG-25 MCG PO TABS: 1.0000 | ORAL_TABLET | Freq: Every day | ORAL | 11 refills | Status: DC

## 2016-05-16 NOTE — Progress Notes (Signed)
Gina Wheeler, is a 21 y.o. female  [id number]  [id number]  DOB - 17-Oct-1995  Chief Complaint  Patient presents with  . Vaginal Discharge        Subjective:   Gina Wheeler is a 21 y.o. female here today for a follow up visit. c/o of thick, mucous vag discharge for the last week, no menses since Feb.  She has been sexually active w/ on/off again bf since I last saw as well. Only protection is the w/drawal method at this time.  Overall doing well w/ elavil.   Co of cough last few days, nonproductive, no f/c. Thought she wheezed a couple times, hx of possible childhood asthma per pt.  Patient has No headache, No chest pain, No abdominal pain - No Nausea, No new weakness tingling or numbness, No Cough - SOB.  No problems updated.  ALLERGIES: Allergies  Allergen Reactions  . Lexapro [Escitalopram Oxalate] Nausea And Vomiting    PAST MEDICAL HISTORY: No past medical history on file.  MEDICATIONS AT HOME: Prior to Admission medications   Medication Sig Start Date End Date Taking? Authorizing Provider  amitriptyline (ELAVIL) 10 MG tablet Take 1 tablet (10 mg total) by mouth at bedtime. 04/13/16   Pete Glatter, MD  carbamide peroxide (DEBROX) 6.5 % otic solution Place 5 drops into the right ear 2 (two) times daily. Patient not taking: Reported on 04/13/2016 02/29/16   Pete Glatter, MD  naproxen (NAPROSYN) 250 MG tablet Take 1 tablet (250 mg total) by mouth 2 (two) times daily with a meal. As needed for pain Patient not taking: Reported on 02/29/2016 10/29/15   Everlene Farrier, PA-C  omeprazole (PRILOSEC) 20 MG capsule Take 1 capsule (20 mg total) by mouth daily. Patient not taking: Reported on 02/29/2016 11/05/15   Anders Simmonds, PA-C  ondansetron (ZOFRAN ODT) 4 MG disintegrating tablet Take 1 tablet (4 mg total) by mouth every 8 (eight) hours as needed for nausea or vomiting. Patient not taking: Reported on 12/01/2015 10/29/15   Everlene Farrier,  PA-C  sulfamethoxazole-trimethoprim (BACTRIM DS,SEPTRA DS) 800-160 MG tablet Take 1 tablet by mouth 2 (two) times daily. Patient not taking: Reported on 05/13/2016 02/29/16   Pete Glatter, MD     Objective:   Vitals:   05/16/16 1630  BP: 124/78  Pulse: (!) 137  Resp: 16  Temp: 98.4 F (36.9 C)  TempSrc: Oral  SpO2: 95%  Weight: 113 lb 9.6 oz (51.5 kg)    Weight unchanged since I last saw her.  Exam General appearance : Awake, alert, not in any distress. Speech Clear. Not toxic looking, pleasant. Thin appearing. HEENT: Atraumatic and Normocephalic, pupils equally reactive to light. Neck: supple, no JVD.  Chest:Good air entry bilaterally, no added sounds.  No w/c/r CVS: S1 S2 regular, no murmurs/gallups or rubs. Abdomen: Bowel sounds active, Non tender and not distended with no gaurding, rigidity or rebound. Pelvic Exam: Cervix normal rotated posterior, hard to visualize entire cervix due to her small size, uncomfortable with first papsmear, excessive mucous dischage. external genitalia normal, no adnexal masses or tenderness, no cervical motion tenderness, rectovaginal septum normal, uterus normal size, shape, and consistency and vagina normal with thick yellow/clear discharge  - no odor.  Extremities: B/L Lower Ext shows no edema, both legs are warm to touch Neurology: Awake alert, and oriented X 3, CN II-XII grossly intact, Non focal Skin:No Rash  Data Review Lab Results  Component Value Date   HGBA1C 5.1 12/01/2015  Depression screen [facility] 2/9 05/16/2016 04/13/2016 02/29/2016 12/01/2015 11/05/2015  Decreased Interest (No Data) 0  Down, Depressed, Hopeless (No Data) 3  PHQ - 2 Score - 3  Altered sleeping (No Data) 2  Tired, decreased energy 0 3  Change in appetite 0 Feeling bad or failure about yourself  0 3 3 (No Data) 3  Trouble concentrating 0 3 1 (No Data) 2  Moving slowly or fidgety/restless 0 0 Suicidal thoughts 0 3 0  (No Data) 3  PHQ-9 Score - 22      Assessment & Plan   1. Vaginal discharge - lengthy discussion w/ pt since she is starting to be sexually active now. Recd condom for protection. - recd birth control, will trial ocp for now, info on different birth control methods provided and she will look into - pt wants to hold off on emp trx of std for now, I am ok with this plan since is reliable. - Urinalysis Dipstick  neg - POCT urine pregnancy  neg - Urine cytology ancillary only - HIV antibody (with reflex) - recd Gardisil rx if she has not had it, we do not have, would recd f/u w/ Health dept to see if she is candidate.  2. Pap smear for cervical cancer screening - Cytology - PAP, may have to repeat due to excessive mucous, despite numerous swabbing.  3. Glenford Peers, recent Advised sx treatment, if she starts to worsen w/ f/c/productive cough, advised to call me and I can call in abx rx.  Patient have been counseled extensively about nutrition and exercise  Return in about 3 months (around 08/15/2016), or if symptoms worsen or fail to improve.  The patient was given clear instructions to go to ER or return to medical center if symptoms don't improve, worsen or new problems develop. The patient verbalized understanding. The patient was told to call to get lab results if they haven't heard anything in the next week.   This note has been created with Education officer, environmental. Any transcriptional errors are unintentional.   Pete Glatter, MD, MBA/MHA [facility] and [facility] [place], [place] [phone]   05/16/2016, 4:21 PM

## 2016-05-16 NOTE — Patient Instructions (Signed)
Contraception Choices Contraception, also called birth control, means things to use or ways to try not to get pregnant. Hormonal birth control  This kind of birth control uses hormones. Here are some types of hormonal birth control:  A tube that is put under skin of the arm (implant). The tube can stay in for as long as 3 years.  Shots to get every 3 months (injections).  Pills to take every day (birth control pills).  A patch to change 1 time each week for 3 weeks (birth control patch). After that, the patch is taken off for 1 week.  A ring to put in the vagina. The ring is left in for 3 weeks. Then it is taken out of the vagina for 1 week. Then a new ring is put in.  Pills to take after unprotected sex (emergency birth control pills). Barrier birth control  Here are some types of barrier birth control:  A thin covering that is put on the penis before sex (female condom). The covering is thrown away after sex.  A soft, loose covering that is put in the vagina before sex (female condom). The covering is thrown away after sex.  A rubber bowl that sits over the cervix (diaphragm). The bowl must be made for you. The bowl is put into the vagina before sex. The bowl is left in for 6-8 hours after sex. It is taken out within 24 hours.  A small, soft cup that fits over the cervix (cervical cap). The cup must be made for you. The cup can be left in for 6-8 hours after sex. It is taken out within 48 hours.  A sponge that is put into the vagina before sex. It must be left in for at least 6 hours after sex. It must be taken out within 30 hours. Then it is thrown away.  A chemical that kills or stops sperm from getting into the uterus (spermicide). It may be a pill, cream, jelly, or foam to put in the vagina. The chemical should be used at least 10-15 minutes before sex. IUD (intrauterine) birth control An IUD is a small, T-shaped piece of plastic. It is put inside the uterus. There are two  kinds:  Hormone IUD. This kind can stay in for 3-5 years.  Copper IUD. This kind can stay in for 10 years. Permanent birth control Here are some types of permanent birth control:  Surgery to block the fallopian tubes.  Having an insert put into each fallopian tube.  Surgery to tie off the tubes that carry sperm (vasectomy). Natural planning birth control Here are some types of natural planning birth control:  Not having sex on the days the woman could get pregnant.  Using a calendar:  To keep track of the length of each period.  To find out what days pregnancy can happen.  To plan to not have sex on days when pregnancy can happen.  Watching for symptoms of ovulation and not having sex during ovulation. One way the woman can check for ovulation is to check her temperature.  Waiting to have sex until after ovulation. Summary  Contraception, also called birth control, means things to use or ways to try not to get pregnant.  Hormonal methods of birth control include implants, injections, pills, patches, vaginal rings, and emergency birth control pills.  Barrier methods of birth control can include female condoms, female condoms, diaphragms, cervical caps, sponges, and spermicides.  There are two types of IUD (intrauterine device)  birth control. An IUD can be put in a woman's uterus to prevent pregnancy for 3-5 years.  Permanent sterilization can be done through a procedure for males, females, or both.  Natural planning methods involve not having sex on the days when the woman could get pregnant. This information is not intended to replace advice given to you by your health care provider. Make sure you discuss any questions you have with your health care provider. Document Released: 11/21/2008 Document Revised: 02/04/2016 Document Reviewed: 02/04/2016 Elsevier Interactive Patient Education  2017 Elsevier Inc.  -  Contraceptive Barrier Methods A barrier method is a type of  birth control (contraception) that is used to prevent pregnancy. Barrier methods include:  Female condom.  Female condom.  Diaphragm.  Cervical cap.  Sponge.  Spermicide. Your health care provider can help you decide what form of contraception is best for you. Always keep in mind that the risk of an STI (sexually transmitted infection) exists even when a contraceptive barrier method is used. Female condom A female condom is a thin sheath that is worn over the penis during sex. Condoms prevent pregnancy by catching and stopping sperm from reaching the uterus. They also help to protect against STIs. Some condoms come with a sperm-killing substance (spermicide) on them. Female condoms are made of latex, rubber, or a type of plastic called polyurethane. Condoms that are made of latex and polyurethane provide the best protection against many STIs, including HIV (human immunodeficiency virus). Female condoms can only be worn once. They should not be used with oil-based lubricants like petroleum jelly, lotions, or oils, because these lubricants make them less effective. They can be used with water-based lubricants available from your health care provider and over-the-counter. Water-based lubricants do not contain silicone, wax, or oil. Female condom The female condom is a soft, loose-fitting sheath that is put into the vagina before sex. It is held in place by two closed inner rings, one at the cervix and the other at the vaginal opening. The female condom prevents pregnancy by catching sperm and blocking its passage to the uterus. It also helps to protect against STIs. A female condom is intended for one-time use only. It can be inserted as long as 8 hours before sex. You should not use a female condom while your partner uses a female condom because the condoms can stick to each other and break. Diaphragm A diaphragm is a soft, latex or silicone dome-shaped barrier that is placed in the vagina with spermicidal  jelly before sex. A diaphragm covers the cervix, kills sperm, and blocks the passage of sperm into the cervix. It does not protect against STIs. This barrier method requires a prescription and must be fitted by a health care provider. A diaphragm can be inserted up to 2 hours before sex. If it is inserted more than 2 hours before sex, the spermicide must be applied again. A diaphragm should be left in the vagina for 6-8 hours after sex. Before sex can occur again during these 6 hours, spermicide must be reapplied. A diaphragm should not be used during a menstrual period. Cervical cap A cervical cap is a round, soft, latex or plastic cup that is put in the vagina and fits over the cervix. It stays in place by the use of suction. A cervical cap should be used with a spermicide. It provides continuous protection as long as it is in place, regardless of how many times you have sex. It does not protect against STIs.  Cervical caps may be inserted as long as 6 hours before sexual activity. They must be left in place for at least 6 hours after sex and can be left in place for as long as 48 hours. A cervical cap must be fitted by a health care provider. If you lose or gain a certain amount of weight your health care provider may need to refit the cap. You cannot use a cervical cap during your period. Sponge A sponge is a soft, circular piece of polyurethane foam that has spermicide in it. It is made wet with clean water and then placed into the vagina and over the cervix before sex. The foam is designed to trap and absorb sperm before it enters the cervix while the spermicide kills or immobilizes sperm. A sponge offers an immediate and continuous presence of spermicide throughout a 24-hour period no matter how many times you have sex. It does not protect against STIs. A sponge should be left in place for at least 6 hours after sex. It should not be left in for more than 24 hours, and it cannot be reused. It has a loop  for removal. This barrier method can be purchased over-the-counter. You may use it if you are breastfeeding. Do not use if you have your period. Spermicide Spermicides are chemicals that kill or block sperm from entering the cervix and uterus. They are inserted into the vagina with an applicator before sex. Spermicides do not protect against STIs. Spermicides come as creams, jellies, suppositories, foam, film, or tablets. Suppositories, film, and tablets should be inserted 10-30 minutes before sex so they can dissolve. To be effective, a new spermicide must be inserted every time you have sex. Summary  A barrier method is a type of birth control (contraception) that is used to prevent pregnancy.  Your health care provider can help you decide what form of contraception is best for you.  Always keep in mind that the risk of an STI (sexually transmitted infection) exists even when a contraceptive barrier method is used. This information is not intended to replace advice given to you by your health care provider. Make sure you discuss any questions you have with your health care provider. Document Released: 11/21/2006 Document Revised: 12/14/2015 Document Reviewed: 12/14/2015 Elsevier Interactive Patient Education  2017 [employer].  -   Safe Sex Practicing safe sex means taking steps before and during sex to reduce your risk of:  Getting an STD (sexually transmitted disease).  Giving your partner an STD.  Unwanted pregnancy. How can I practice safe sex?   To practice safe sex:  Limit your sexual partners to only one partner who is having sex with only you.  Avoid using alcohol and recreational drugs before having sex. These substances can affect your judgment.  Before having sex with a new partner:  Talk to your partner about past partners, past STDs, and drug use.  You and your partner should be screened for STDs and discuss the results with each other.  Check your body  regularly for sores, blisters, rashes, or unusual discharge. If you notice any of these problems, visit your health care provider.  If you have symptoms of an infection or you are being treated for an STD, avoid sexual contact.  While having sex, use a condom. Make sure to:  Use a condom every time you have vaginal, oral, or anal sex. Both females and males should wear condoms during oral sex.  Keep condoms in place from the  beginning to the end of sexual activity.  Use a latex condom, if possible. Latex condoms offer the best protection.  Use only water-based lubricants or oils to lubricate a condom. Using petroleum-based lubricants or oils will weaken the condom and increase the chance that it will break.  See your health care provider for regular screenings, exams, and tests for STDs.  Talk with your health care provider about the form of birth control (contraception) that is best for you.  Get vaccinated against hepatitis B and human papillomavirus (HPV).  If you are at risk of being infected with HIV (human immunodeficiency virus), talk with your health care provider about taking a prescription medicine to prevent HIV infection. You are considered at risk for HIV if:  You are a man who has sex with other men.  You are a heterosexual man or woman who is sexually active with more than one partner.  You take drugs by injection.  You are sexually active with a partner who has HIV. This information is not intended to replace advice given to you by your health care provider. Make sure you discuss any questions you have with your health care provider. Document Released: 03/03/2004 Document Revised: 06/10/2015 Document Reviewed: 12/14/2014 Elsevier Interactive Patient Education  2017 [employer].

## 2016-05-17 LAB — HIV ANTIBODY (ROUTINE TESTING W REFLEX): HIV Screen 4th Generation wRfx: NONREACTIVE

## 2016-05-18 LAB — URINE CYTOLOGY ANCILLARY ONLY
Chlamydia: NEGATIVE
Neisseria Gonorrhea: NEGATIVE
Trichomonas: NEGATIVE

## 2016-05-18 LAB — CERVICOVAGINAL ANCILLARY ONLY
Chlamydia: NEGATIVE
Neisseria Gonorrhea: NEGATIVE
Wet Prep (BD Affirm): NEGATIVE

## 2016-05-20 ENCOUNTER — Telehealth: Payer: Self-pay | Admitting: Internal Medicine

## 2016-05-20 LAB — CYTOLOGY - PAP: Diagnosis: NEGATIVE

## 2016-05-20 LAB — URINE CYTOLOGY ANCILLARY ONLY
Bacterial vaginitis: NEGATIVE
Candida vaginitis: NEGATIVE

## 2016-05-20 LAB — CERVICOVAGINAL ANCILLARY ONLY: HERPES (WINDOWPATH): NEGATIVE

## 2016-05-20 NOTE — Telephone Encounter (Signed)
Patient called the office asking to speak with nurse in regards to the results from her lab work on Monday. Please follow up.  Thank you

## 2016-05-23 ENCOUNTER — Telehealth: Payer: Self-pay

## 2016-05-23 NOTE — Telephone Encounter (Signed)
Pt is aware of results. 

## 2016-05-23 NOTE — Telephone Encounter (Signed)
Contacted pt to go over lab results and pap results pt is aware of results and doesn't have any questions or concerns

## 2016-06-06 ENCOUNTER — Ambulatory Visit: Payer: Medicaid Other | Attending: Internal Medicine | Admitting: Internal Medicine

## 2016-06-06 ENCOUNTER — Encounter: Payer: Self-pay | Admitting: Internal Medicine

## 2016-06-06 VITALS — BP 111/72 | HR 119 | Temp 99.2°F | Resp 16 | Wt 112.6 lb

## 2016-06-06 DIAGNOSIS — Z79899 Other long term (current) drug therapy: Secondary | ICD-10-CM | POA: Diagnosis not present

## 2016-06-06 DIAGNOSIS — F329 Major depressive disorder, single episode, unspecified: Secondary | ICD-10-CM | POA: Insufficient documentation

## 2016-06-06 DIAGNOSIS — Z888 Allergy status to other drugs, medicaments and biological substances status: Secondary | ICD-10-CM | POA: Insufficient documentation

## 2016-06-06 DIAGNOSIS — F419 Anxiety disorder, unspecified: Secondary | ICD-10-CM | POA: Diagnosis not present

## 2016-06-06 DIAGNOSIS — R634 Abnormal weight loss: Secondary | ICD-10-CM | POA: Diagnosis not present

## 2016-06-06 DIAGNOSIS — Z681 Body mass index (BMI) 19 or less, adult: Secondary | ICD-10-CM | POA: Diagnosis not present

## 2016-06-06 DIAGNOSIS — R05 Cough: Secondary | ICD-10-CM | POA: Diagnosis present

## 2016-06-06 DIAGNOSIS — J40 Bronchitis, not specified as acute or chronic: Secondary | ICD-10-CM | POA: Diagnosis not present

## 2016-06-06 MED ORDER — AMITRIPTYLINE HCL 10 MG PO TABS: 10.0000 mg | ORAL_TABLET | Freq: Every day | ORAL | 1 refills | Status: AC

## 2016-06-06 MED ORDER — AZITHROMYCIN 250 MG PO TABS: ORAL_TABLET | ORAL | 0 refills | Status: DC

## 2016-06-06 NOTE — Progress Notes (Signed)
Gina Wheeler, is a 21 y.o. female  [id number]  [id number]  DOB - Apr 12, 1995  Chief Complaint  Patient presents with  . Cough  . Chills        Subjective:   Gina Wheeler is a 21 y.o. female here today for a follow up visit, cold/subjective fevers since Sat w/ unproductive cough.  Per pt no hx of asthma, but occasionally wheezing noted at home.    Of note, last seen 05/16/16, vaginal discharge improved, taking birth control pills now w/o difficulty. Elavil  working overall, denies si/hi/avh.   Patient has No headache, No chest pain, No abdominal pain - No Nausea, No new weakness tingling or numbness, No Cough - SOB.  No problems updated.  ALLERGIES: Allergies  Allergen Reactions  . Lexapro [Escitalopram Oxalate] Nausea And Vomiting    PAST MEDICAL HISTORY: No past medical history on file.  MEDICATIONS AT HOME: Prior to Admission medications   Medication Sig Start Date End Date Taking? Authorizing Provider  amitriptyline (ELAVIL) 10 MG tablet Take 1 tablet (10 mg total) by mouth at bedtime. 06/06/16   Pete Glatter, MD  azithromycin (ZITHROMAX) 250 MG tablet Day 1 take 2 tabs, than 1 tab daily x 4 days. 06/06/16   Pete Glatter, MD  Norgestimate-Ethinyl Estradiol Triphasic (ORTHO TRI-CYCLEN LO) 0.18/0.215/0.25 MG-25 MCG tab Take 1 tablet by mouth daily. 05/16/16   Pete Glatter, MD  omeprazole (PRILOSEC) 20 MG capsule Take 1 capsule (20 mg total) by mouth daily. Patient not taking: Reported on 02/29/2016 11/05/15   Anders Simmonds, PA-C  ondansetron (ZOFRAN ODT) 4 MG disintegrating tablet Take 1 tablet (4 mg total) by mouth every 8 (eight) hours as needed for nausea or vomiting. Patient not taking: Reported on 12/01/2015 10/29/15   Everlene Farrier, PA-C     Objective:   Vitals:   06/06/16 1422  BP: 111/72  Pulse: (!) 119  Resp: 16  Temp: 99.2 F (37.3 C)  TempSrc: Oral  SpO2: 95%  Weight: 112 lb 9.6 oz (51.1 kg)     Exam General appearance : Awake, alert, not in any distress. Speech Clear. Not toxic looking, thin appearing. HEENT: Atraumatic and Normocephalic, pupils equally reactive to light. Neck: supple, no JVD. No cervical lymphadenopathy.  Chest: lungs clear on left side, but diminished on right side, no w/c/r noted though, more upper airway bronchial sounds on right. CVS: S1 S2 regular, no murmurs/gallups or rubs. Abdomen: Bowel sounds active, Non tender and not distended with no gaurding, rigidity or rebound. Extremities: B/L Lower Ext shows no edema, both legs are warm to touch Neurology: Awake alert, and oriented X 3, CN II-XII grossly intact, Non focal Skin:No Rash  Data Review Lab Results  Component Value Date   HGBA1C 5.1 12/01/2015    Depression screen [facility] 2/9 06/06/2016 05/16/2016 04/13/2016 02/29/2016 12/01/2015  Decreased Interest (No Data)  Down, Depressed, Hopeless 0 (No Data)  PHQ - 2 Score -  Altered sleeping (No Data)  Tired, decreased energy 0  Change in appetite 1 0 Feeling bad or failure about yourself  0 0 3 3 (No Data)  Trouble concentrating 0 0 3 1 (No Data)  Moving slowly or fidgety/restless 0 0 0 3 2  Suicidal thoughts 0 0 3 0 (No Data)  PHQ-9 Score -      Assessment &  Plan   1. Bronchitis,  - suspected viral, but given diminished sounds on right, will empirically treat w/ azithromycin x 5 days Recd otc antitussives such as mucinex bid or robitussin.  2. Birthcontrol  - doing well on ocp  3. Weight loss -  Same weight as last, encouraged po intake  4. Anxiety/depression, mild - doing better on elavil 10 qd., phq9 scores downtrending.     Patient have been counseled extensively about nutrition and exercise  Return in about 3 months (around 09/05/2016), or if symptoms worsen or fail to improve.  The patient was given clear instructions to go to ER or return to medical center if symptoms don't  improve, worsen or new problems develop. The patient verbalized understanding. The patient was told to call to get lab results if they haven't heard anything in the next week.   This note has been created with Education officer, environmental. Any transcriptional errors are unintentional.   Pete Glatter, MD, MBA/MHA [facility] and [facility] [place], [place] [phone]   06/06/2016, 2:36 PM

## 2016-06-06 NOTE — Patient Instructions (Addendum)
Cough medicine as need  For cough - ie mucinex  2x day or robitussin as needed.   Acute Bronchitis, Adult Acute bronchitis is when air tubes (bronchi) in the lungs suddenly get swollen. The condition can make it hard to breathe. It can also cause these symptoms:  A cough.  Coughing up clear, yellow, or green mucus.  Wheezing.  Chest congestion.  Shortness of breath.  A fever.  Body aches.  Chills.  A sore throat. Follow these instructions at home: Medicines   Take over-the-counter and prescription medicines only as told by your doctor.  If you were prescribed an antibiotic medicine, take it as told by your doctor. Do not stop taking the antibiotic even if you start to feel better. General instructions   Rest.  Drink enough fluids to keep your pee (urine) clear or pale yellow.  Avoid smoking and secondhand smoke. If you smoke and you need help quitting, ask your doctor. Quitting will help your lungs heal faster.  Use an inhaler, cool mist vaporizer, or humidifier as told by your doctor.  Keep all follow-up visits as told by your doctor. This is important. How is this prevented? To lower your risk of getting this condition again:  Wash your hands often with soap and water. If you cannot use soap and water, use hand sanitizer.  Avoid contact with people who have cold symptoms.  Try not to touch your hands to your mouth, nose, or eyes.  Make sure to get the flu shot every year. Contact a doctor if:  Your symptoms do not get better in 2 weeks. Get help right away if:  You cough up blood.  You have chest pain.  You have very bad shortness of breath.  You become dehydrated.  You faint (pass out) or keep feeling like you are going to pass out.  You keep throwing up (vomiting).  You have a very bad headache.  Your fever or chills gets worse. This information is not intended to replace advice given to you by your health care provider. Make sure you  discuss any questions you have with your health care provider. Document Released: 07/13/2007 Document Revised: 09/02/2015 Document Reviewed: 07/15/2015 Elsevier Interactive Patient Education  2017 [employer].

## 2016-06-07 ENCOUNTER — Ambulatory Visit: Payer: Medicaid Other | Admitting: Internal Medicine

## 2016-06-23 ENCOUNTER — Encounter: Payer: Self-pay | Admitting: Internal Medicine

## 2016-07-25 ENCOUNTER — Encounter: Payer: Self-pay | Admitting: Nurse Practitioner

## 2016-07-25 ENCOUNTER — Ambulatory Visit (INDEPENDENT_AMBULATORY_CARE_PROVIDER_SITE_OTHER): Payer: Self-pay | Admitting: Nurse Practitioner

## 2016-07-25 VITALS — BP 90/70 | HR 107 | Temp 98.5°F | Ht 67.0 in | Wt 115.0 lb

## 2016-07-25 DIAGNOSIS — Z0000 Encounter for general adult medical examination without abnormal findings: Secondary | ICD-10-CM

## 2016-07-25 LAB — POCT URINALYSIS DIPSTICK
Bilirubin, UA: NEGATIVE
Blood, UA: NEGATIVE
Glucose, UA: NEGATIVE
KETONES UA: NEGATIVE
Nitrite, UA: NEGATIVE
PH UA: 6 (ref 5.0–8.0)
Protein, UA: NEGATIVE
SPEC GRAV UA: 1.025 (ref 1.010–1.025)
Urobilinogen, UA: 0.2 E.U./dL

## 2016-07-25 LAB — POCT URINE PREGNANCY: Preg Test, Ur: NEGATIVE

## 2016-07-25 MED ORDER — NITROFURANTOIN MONOHYD MACRO 100 MG PO CAPS: 100.0000 mg | ORAL_CAPSULE | Freq: Two times a day (BID) | ORAL | 0 refills | Status: AC

## 2016-07-25 NOTE — Patient Instructions (Addendum)
Preventive Care 18-39 Years, Female Preventive care refers to lifestyle choices and visits with your health care provider that can promote health and wellness. What does preventive care include?  A yearly physical exam. This is also called an annual well check.  Dental exams once or twice a year.  Routine eye exams. Ask your health care provider how often you should have your eyes checked.  Personal lifestyle choices, including: ? Daily care of your teeth and gums. ? Regular physical activity. ? Eating a healthy diet. ? Avoiding tobacco and drug use. ? Limiting alcohol use. ? Practicing safe sex. ? Taking vitamin and mineral supplements as recommended by your health care provider. What happens during an annual well check? The services and screenings done by your health care provider during your annual well check will depend on your age, overall health, lifestyle risk factors, and family history of disease. Counseling Your health care provider may ask you questions about your:  Alcohol use.  Tobacco use.  Drug use.  Emotional well-being.  Home and relationship well-being.  Sexual activity.  Eating habits.  Work and work Statistician.  Method of birth control.  Menstrual cycle.  Pregnancy history.  Screening You may have the following tests or measurements:  Height, weight, and BMI.  Diabetes screening. This is done by checking your blood sugar (glucose) after you have not eaten for a while (fasting).  Blood pressure.  Lipid and cholesterol levels. These may be checked every 5 years starting at age 66.  Skin check.  Hepatitis C blood test.  Hepatitis B blood test.  Sexually transmitted disease (STD) testing.  BRCA-related cancer screening. This may be done if you have a family history of breast, ovarian, tubal, or peritoneal cancers.  Pelvic exam and Pap test. This may be done every 3 years starting at age 40. Starting at age 59, this may be done every 5  years if you have a Pap test in combination with an HPV test.  Discuss your test results, treatment options, and if necessary, the need for more tests with your health care provider. Vaccines Your health care provider may recommend certain vaccines, such as:  Influenza vaccine. This is recommended every year.  Tetanus, diphtheria, and acellular pertussis (Tdap, Td) vaccine. You may need a Td booster every 10 years.  Varicella vaccine. You may need this if you have not been vaccinated.  HPV vaccine. If you are 69 or younger, you may need three doses over 6 months.  Measles, mumps, and rubella (MMR) vaccine. You may need at least one dose of MMR. You may also need a second dose.  Pneumococcal 13-valent conjugate (PCV13) vaccine. You may need this if you have certain conditions and were not previously vaccinated.  Pneumococcal polysaccharide (PPSV23) vaccine. You may need one or two doses if you smoke cigarettes or if you have certain conditions.  Meningococcal vaccine. One dose is recommended if you are age 27-21 years and a first-year college student living in a residence hall, or if you have one of several medical conditions. You may also need additional booster doses.  Hepatitis A vaccine. You may need this if you have certain conditions or if you travel or work in places where you may be exposed to hepatitis A.  Hepatitis B vaccine. You may need this if you have certain conditions or if you travel or work in places where you may be exposed to hepatitis B.  Haemophilus influenzae type b (Hib) vaccine. You may need this if  you have certain risk factors.  Talk to your health care provider about which screenings and vaccines you need and how often you need them. This information is not intended to replace advice given to you by your health care provider. Make sure you discuss any questions you have with your health care provider. Document Released: 03/22/2001 Document Revised: 10/14/2015  Document Reviewed: 11/25/2014 Elsevier Interactive Patient Education  2017 [place] Maintenance, Female Adopting a healthy lifestyle and getting preventive care can go a long way to promote health and wellness. Talk with your health care provider about what schedule of regular examinations is right for you. This is a good chance for you to check in with your provider about disease prevention and staying healthy. In between checkups, there are plenty of things you can do on your own. Experts have done a lot of research about which lifestyle changes and preventive measures are most likely to keep you healthy. Ask your health care provider for more information. Weight and diet Eat a healthy diet  Be sure to include plenty of vegetables, fruits, low-fat dairy products, and lean protein.  Do not eat a lot of foods high in solid fats, added sugars, or salt.  Get regular exercise. This is one of the most important things you can do for your health. ? Most adults should exercise for at least 150 minutes each week. The exercise should increase your heart rate and make you sweat (moderate-intensity exercise). ? Most adults should also do strengthening exercises at least twice a week. This is in addition to the moderate-intensity exercise.  Maintain a healthy weight  Body mass index (BMI) is a measurement that can be used to identify possible weight problems. It estimates body fat based on height and weight. Your health care provider can help determine your BMI and help you achieve or maintain a healthy weight.  For females 25 years of age and older: ? A BMI below 18.5 is considered underweight. ? A BMI of 18.5 to 24.9 is normal. ? A BMI of 25 to 29.9 is considered overweight. ? A BMI of 30 and above is considered obese.  Watch levels of cholesterol and blood lipids  You should start having your blood tested for lipids and cholesterol at 21 years of age, then have this test every 5  years.  You may need to have your cholesterol levels checked more often if: ? Your lipid or cholesterol levels are high. ? You are older than 21 years of age. ? You are at high risk for heart disease.  Cancer screening Lung Cancer  Lung cancer screening is recommended for adults 41-71 years old who are at high risk for lung cancer because of a history of smoking.  A yearly low-dose CT scan of the lungs is recommended for people who: ? Currently smoke. ? Have quit within the past 15 years. ? Have at least a 30-pack-year history of smoking. A pack year is smoking an average of one pack of cigarettes a day for 1 year.  Yearly screening should continue until it has been 15 years since you quit.  Yearly screening should stop if you develop a health problem that would prevent you from having lung cancer treatment.  Breast Cancer  Practice breast self-awareness. This means understanding how your breasts normally appear and feel.  It also means doing regular breast self-exams. Let your health care provider know about any changes, no matter how small.  If you are in your 12s  or 30s, you should have a clinical breast exam (CBE) by a health care provider every 1-3 years as part of a regular health exam.  If you are 40 or older, have a CBE every year. Also consider having a breast X-ray (mammogram) every year.  If you have a family history of breast cancer, talk to your health care provider about genetic screening.  If you are at high risk for breast cancer, talk to your health care provider about having an MRI and a mammogram every year.  Breast cancer gene (BRCA) assessment is recommended for women who have family members with BRCA-related cancers. BRCA-related cancers include: ? Breast. ? Ovarian. ? Tubal. ? Peritoneal cancers.  Results of the assessment will determine the need for genetic counseling and BRCA1 and BRCA2 testing.  Cervical Cancer Your health care provider may  recommend that you be screened regularly for cancer of the pelvic organs (ovaries, uterus, and vagina). This screening involves a pelvic examination, including checking for microscopic changes to the surface of your cervix (Pap test). You may be encouraged to have this screening done every 3 years, beginning at age 21.  For women ages 30-65, health care providers may recommend pelvic exams and Pap testing every 3 years, or they may recommend the Pap and pelvic exam, combined with testing for human papilloma virus (HPV), every 5 years. Some types of HPV increase your risk of cervical cancer. Testing for HPV may also be done on women of any age with unclear Pap test results.  Other health care providers may not recommend any screening for nonpregnant women who are considered low risk for pelvic cancer and who do not have symptoms. Ask your health care provider if a screening pelvic exam is right for you.  If you have had past treatment for cervical cancer or a condition that could lead to cancer, you need Pap tests and screening for cancer for at least 20 years after your treatment. If Pap tests have been discontinued, your risk factors (such as having a new sexual partner) need to be reassessed to determine if screening should resume. Some women have medical problems that increase the chance of getting cervical cancer. In these cases, your health care provider may recommend more frequent screening and Pap tests.  Colorectal Cancer  This type of cancer can be detected and often prevented.  Routine colorectal cancer screening usually begins at 21 years of age and continues through 21 years of age.  Your health care provider may recommend screening at an earlier age if you have risk factors for colon cancer.  Your health care provider may also recommend using home test kits to check for hidden blood in the stool.  A small camera at the end of a tube can be used to examine your colon directly  (sigmoidoscopy or colonoscopy). This is done to check for the earliest forms of colorectal cancer.  Routine screening usually begins at age 50.  Direct examination of the colon should be repeated every 5-10 years through 21 years of age. However, you may need to be screened more often if early forms of precancerous polyps or small growths are found.  Skin Cancer  Check your skin from head to toe regularly.  Tell your health care provider about any new moles or changes in moles, especially if there is a change in a mole's shape or color.  Also tell your health care provider if you have a mole that is larger than the size of a   pencil eraser.  Always use sunscreen. Apply sunscreen liberally and repeatedly throughout the day.  Protect yourself by wearing long sleeves, pants, a wide-brimmed hat, and sunglasses whenever you are outside.  Heart disease, diabetes, and high blood pressure  High blood pressure causes heart disease and increases the risk of stroke. High blood pressure is more likely to develop in: ? People who have blood pressure in the high end of the normal range (130-139/85-89 mm Hg). ? People who are overweight or obese. ? People who are African American.  If you are 18-92 years of age, have your blood pressure checked every 3-5 years. If you are 9 years of age or older, have your blood pressure checked every year. You should have your blood pressure measured twice-once when you are at a hospital or clinic, and once when you are not at a hospital or clinic. Record the average of the two measurements. To check your blood pressure when you are not at a hospital or clinic, you can use: ? An automated blood pressure machine at a pharmacy. ? A home blood pressure monitor.  If you are between 34 years and 63 years old, ask your health care provider if you should take aspirin to prevent strokes.  Have regular diabetes screenings. This involves taking a blood sample to check your  fasting blood sugar level. ? If you are at a normal weight and have a low risk for diabetes, have this test once every three years after 21 years of age. ? If you are overweight and have a high risk for diabetes, consider being tested at a younger age or more often. Preventing infection Hepatitis B  If you have a higher risk for hepatitis B, you should be screened for this virus. You are considered at high risk for hepatitis B if: ? You were born in a country where hepatitis B is common. Ask your health care provider which countries are considered high risk. ? Your parents were born in a high-risk country, and you have not been immunized against hepatitis B (hepatitis B vaccine). ? You have HIV or AIDS. ? You use needles to inject street drugs. ? You live with someone who has hepatitis B. ? You have had sex with someone who has hepatitis B. ? You get hemodialysis treatment. ? You take certain medicines for conditions, including cancer, organ transplantation, and autoimmune conditions.  Hepatitis C  Blood testing is recommended for: ? Everyone born from [birth year] through 1965. ? Anyone with known risk factors for hepatitis C.  Sexually transmitted infections (STIs)  You should be screened for sexually transmitted infections (STIs) including gonorrhea and chlamydia if: ? You are sexually active and are younger than 21 years of age. ? You are older than 21 years of age and your health care provider tells you that you are at risk for this type of infection. ? Your sexual activity has changed since you were last screened and you are at an increased risk for chlamydia or gonorrhea. Ask your health care provider if you are at risk.  If you do not have HIV, but are at risk, it may be recommended that you take a prescription medicine daily to prevent HIV infection. This is called pre-exposure prophylaxis (PrEP). You are considered at risk if: ? You are sexually active and do not regularly use condoms  or know the HIV status of your partner(s). ? You take drugs by injection. ? You are sexually active with a partner who has HIV.  Talk with your health care provider about whether you are at high risk of being infected with HIV. If you choose to begin PrEP, you should first be tested for HIV. You should then be tested every 3 months for as long as you are taking PrEP. Pregnancy  If you are premenopausal and you may become pregnant, ask your health care provider about preconception counseling.  If you may become pregnant, take 400 to 800 micrograms (mcg) of folic acid every day.  If you want to prevent pregnancy, talk to your health care provider about birth control (contraception). Osteoporosis and menopause  Osteoporosis is a disease in which the bones lose minerals and strength with aging. This can result in serious bone fractures. Your risk for osteoporosis can be identified using a bone density scan.  If you are 63 years of age or older, or if you are at risk for osteoporosis and fractures, ask your health care provider if you should be screened.  Ask your health care provider whether you should take a calcium or vitamin D supplement to lower your risk for osteoporosis.  Menopause may have certain physical symptoms and risks.  Hormone replacement therapy may reduce some of these symptoms and risks. Talk to your health care provider about whether hormone replacement therapy is right for you. Follow these instructions at home:  Schedule regular health, dental, and eye exams.  Stay current with your immunizations.  Do not use any tobacco products including cigarettes, chewing tobacco, or electronic cigarettes.  If you are pregnant, do not drink alcohol.  If you are breastfeeding, limit how much and how often you drink alcohol.  Limit alcohol intake to no more than 1 drink per day for nonpregnant women. One drink equals 12 ounces of beer, 5 ounces of wine, or 1 ounces of hard  liquor.  Do not use street drugs.  Do not share needles.  Ask your health care provider for help if you need support or information about quitting drugs.  Tell your health care provider if you often feel depressed.  Tell your health care provider if you have ever been abused or do not feel safe at home.  Use Zantac 36m once daily for continued reflux symptoms. This information is not intended to replace advice given to you by your health care provider. Make sure you discuss any questions you have with your health care provider. Document Released: 08/09/2010 Document Revised: 07/02/2015 Document Reviewed: 10/28/2014 Elsevier Interactive Patient Education  2018 [place]ually Transmitted Infections, Adult Sexually transmitted infections (STIs) are diseases that are passed (transmitted) from person to person through bodily fluids exchanged during sex or sexual contact. Bodily fluids include saliva, semen, blood, vaginal mucus, and urine. You may have an increased risk for developing an STI if you have unprotected oral, vaginal, or anal sex. Some common STIs include:  Herpes.  Hepatitis B.  Chlamydia.  Gonorrhea.  Syphilis.  HPV (human papillomavirus).  HIV (humanimmunodeficiency virus), the virus that can cause AIDS (acquired immunodeficiency virus).  How can I protect myself from sexually transmitted infections? The only way to completely prevent STIs is not to have sex of any kind (practice abstinence). This includes oral, vaginal, or anal sex. If you are sexually active, take these actions to lower your risk of getting an STI:  Have only one sex partner (be monogamous) or limit the number of sexual partners you have.  Stay up-to-date on immunizations. Certain vaccines can lower your risk of getting certain STIs,  such as: ? Hepatitis A and B vaccines. You may have been vaccinated as a young child, but likely need a booster shot as a teen or young  adult. ? HPV vaccine. This vaccine is recommended if you are a man under age 67 or a woman under age 62.  Use methods that prevent the exchange of body fluids between partners (barrier protection) every time you have sex. Barrier protection can be used during oral, vaginal, or anal sex. Commonly used barrier methods include: ? Female condom. ? Female condom. ? Dental dam.  Get tested regularly for STIs. Have your sexual partner get tested regularly as well.  Avoid mixing alcohol, drugs, and sex. Alcohol and drug use can affect your ability to make good decisions and can lead to risky sexual behaviors.  Ask your health care provider about taking pre-exposure prophylaxis (PrEP) to prevent HIV infection if you: ? Have a HIV-positive sexual partner. ? Have multiple sexual partners or partners who do not know their HIV status, and do not regularly use a condom during sex. ? Use injection drugs and share needles.  Birth control pills, injections, implants, and intrauterine devices (IUDs) do not protect against STIs. To prevent both STIs and pregnancy, always use a condom with another form of birth control. Some STIs, such as herpes, are spread through skin to skin contact. A condom does not protect you from getting such STIs. If you or your partner have herpes and there is an active flare with open sores, avoid all sexual contact. Why are these changes important? Taking steps to practice safe sex protects you and others. Many STIs can be cured. However, some STIs are not curable and will affect you for the rest of your life. STIs can be passed on to another person even if you do not have symptoms. What can happen if changes are not made? Certain STIs may:  Require you to take medicine for the rest of your life.  Affect your ability to have children (your fertility).  Increase your risk for developing another STI or certain serious health conditions, such as: ? Cervical cancer. ? Head and neck  cancer. ? Pelvic inflammatory disease (PID) in women. ? Organ damage or damage to other parts of your body, if the infection spreads.  Be passed to a baby during childbirth.  How are sexually transmitted infections treated? If you or your partner know or think that you may have an STI:  Talk with your healthcare provider about what can be done to treat it. Some STIs can be treated and cured with medicines.  For curable STIs, you and your partner should avoid sex during treatment and for several days after treatment is complete.  You and your partner should both be treated at the same time, if there is any chance that your partner is infected as well. If you get treatment but your partner does not, your partner can re-infect you when you resume sexual contact.  Do not have unprotected sex.  Where to find more information: Learn more about sexually transmitted diseases and infections from:  Centers for Disease Control and Prevention: ? More information about specific STIs: AppraiserFraud.fi ? Find places to get sexual health counseling and treatment for free or for a low cost: gettested.StoreMirror.com.cy  [place] Department of Health and Human Services: http://white.info/.html  Go to local health department for full STD workup.  Summary  The only way to completely prevent STIs is not to have sex (practice abstinence), including oral, vaginal,  or anal sex.  STIs can spread through saliva, semen, blood, vaginal mucus, urine, or sexual contact.  If you do have sex, limit your number of sexual partners and use a barrier protection method every time you have sex.  If you develop an STI, get treated right away and ask your partner to be treated as well. Do not resume having sex until both of you have completed treatment for the STI. This information is not intended to replace advice given to you by your health care provider.  Make sure you discuss any questions you have with your health care provider. Document Released: 01/21/2016 Document Revised: 01/21/2016 Document Reviewed: 01/21/2016 Elsevier Interactive Patient Education  [employer].

## 2016-07-25 NOTE — Progress Notes (Signed)
Subjective:  Gina Wheeler is a 21 y.o. female who presents for complete physical exam. Patient denies heart, lung, liver or kidney disease, diabetes, seizures or asthma.  Patient c/o nausea only in the mornings, which resides during the day.  The patient states she has tried taking omeprazole, which did not help.  Patient's LMP: Jul 07, 2016.  Patient is sexually active with one partner.    Immunization History  Administered Date(s) Administered  . Influenza,inj,Quad PF,36+ Mos 10/26/2015  . PPD Test 11/24/2015  . Tdap 12/01/2015    No past medical history on file.  No past surgical history on file.  Social History  Substance Use Topics  . Smoking status: Never Smoker  . Smokeless tobacco: Never Used  . Alcohol use No    Allergies  Allergen Reactions  . Lexapro [Escitalopram Oxalate] Nausea And Vomiting    Current Outpatient Prescriptions  Medication Sig Dispense Refill  . amitriptyline (ELAVIL) 10 MG tablet Take 1 tablet (10 mg total) by mouth at bedtime. 30 tablet 1  . omeprazole (PRILOSEC) 20 MG capsule Take 1 capsule (20 mg total) by mouth daily. 30 capsule 2   No current facility-administered medications for this visit.     Review of Systems  Constitutional: Negative.   HENT: Negative.   Eyes:       Watery eyes  Cardiovascular: Negative.   Gastrointestinal: Positive for constipation.       History of constipation, patient takes she should take a stool softner  Genitourinary: Negative.   Musculoskeletal: Negative.   Skin: Negative.   Endo/Heme/Allergies: Positive for environmental allergies.  Psychiatric/Behavioral: Negative.     BP 90/70 (BP Location: Right Arm, Patient Position: Sitting, Cuff Size: Normal)   Pulse (!) 107   Temp 98.5 F (36.9 C) (Oral)   Ht 5\' 7"  (1.702 m)   Wt 115 lb (52.2 kg)   SpO2 96%   BMI 18.01 kg/m    Objective:  BP 90/70 (BP Location: Right Arm, Patient Position: Sitting, Cuff Size: Normal)   Pulse (!) 107    Temp 98.5 F (36.9 C) (Oral)   Ht 5\' 7"  (1.702 m)   Wt 115 lb (52.2 kg)   SpO2 96%   BMI 18.01 kg/m   General Appearance:  Alert, cooperative, no distress, appears stated age  Head:  Normocephalic, without obvious abnormality, atraumatic  Eyes:  PERRL, conjunctiva/corneas clear, EOM's intact, fundi benign, both eyes  Ears:  Normal TM's and external ear canals, both ears  Nose: Nares normal, septum midline,mucosa normal, no drainage or sinus tenderness  Throat: Lips, mucosa, and tongue normal; teeth and gums normal  Neck: Supple, symmetrical, trachea midline, no adenopathy;  thyroid: not enlarged, symmetric, no tenderness/mass/nodules; no carotid bruit or JVD  Back:   Symmetric, no curvature, ROM normal, no CVA tenderness  Lungs:   Clear to auscultation bilaterally, respirations unlabored  Breasts:  No masses or tenderness  Heart:  Regular rate and rhythm, S1 and S2 normal, no murmur, rub, or gallop  Abdomen:   Soft, non-tender, bowel sounds active all four quadrants,  no masses, no organomegaly  Pelvic: Deferred  Extremities: Extremities normal, atraumatic, no cyanosis or edema  Pulses: 2+ and symmetric  Skin: Skin color, texture, turgor normal, no rashes or lesions  Lymph nodes: Cervical, supraclavicular, and axillary nodes normal  Neurologic: Normal        Assessment:  complete physical exam and Urinary Tract Infection    Plan:  Patient education provided.  Ordered the following  labs:  cbc with differential, cmp and thyroid panel. Patient will follow up with PCP.  Patient education provided for preventive care.  Patient also given information on health maintenance, Urinary Tract Infection and sexually transmitted infections.  Patient will try Zantac 75mg  for reflux symptoms. Patient will follow up as needed.  Patient requesting STD workup; [facility]  Will call with other lab results.

## 2017-08-26 IMAGING — US US ABDOMEN LIMITED
1 series · 14 of 25 positions shown · non-contrast
Comparison: None.

CLINICAL DATA: Nausea for 3 weeks.

EXAM:
US ABDOMEN LIMITED - RIGHT UPPER QUADRANT

[Series 1: us abdomen limited · 0.20mm/px · 14 of 35 slices shown]
[im 1/35]
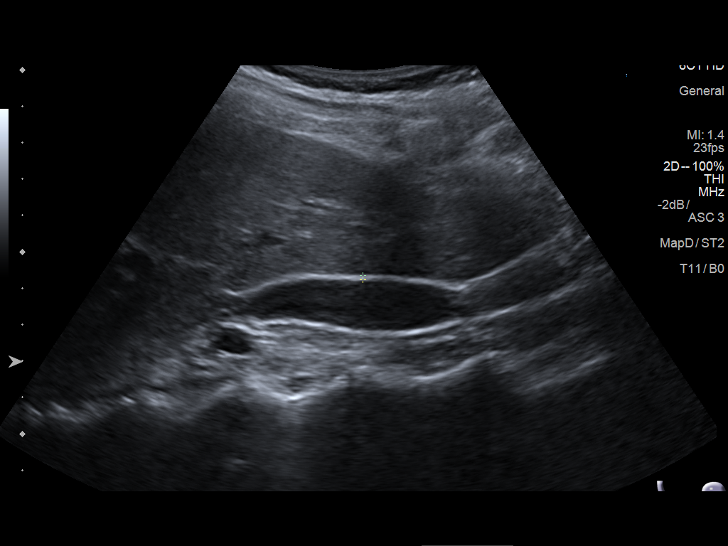
[im 3/35]
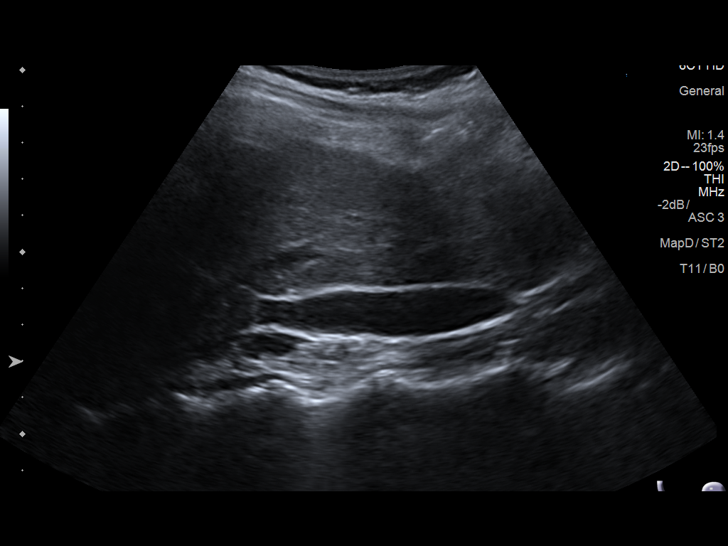
[im 6/35]
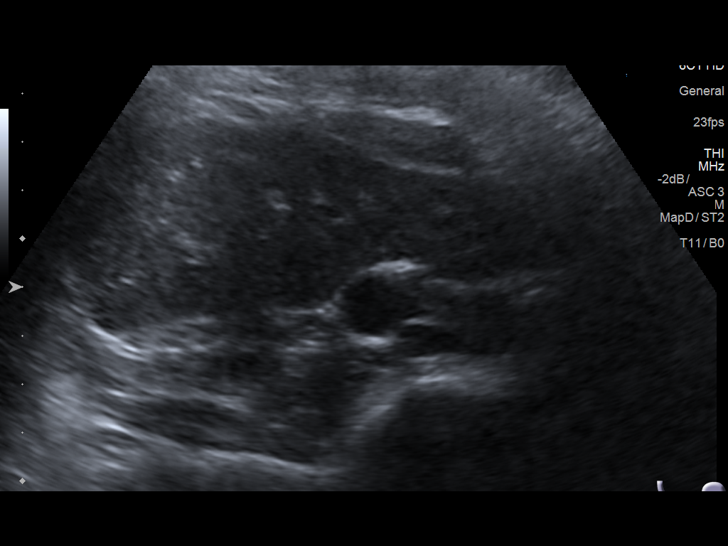
[im 9/35]
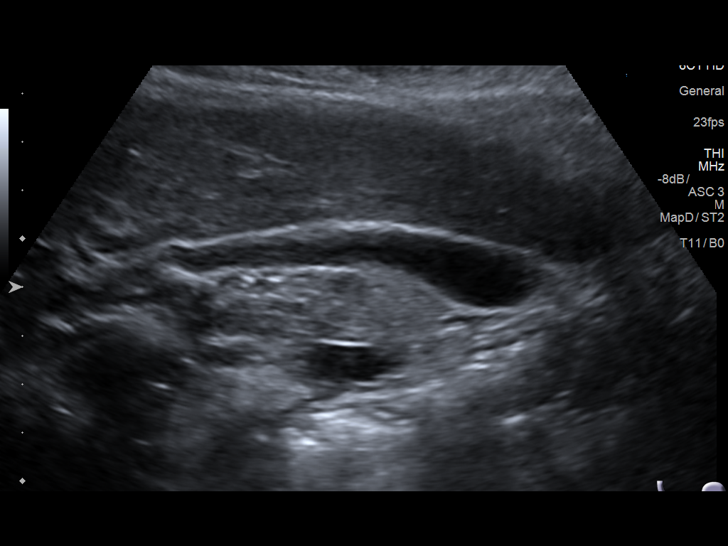
[im 12/35]
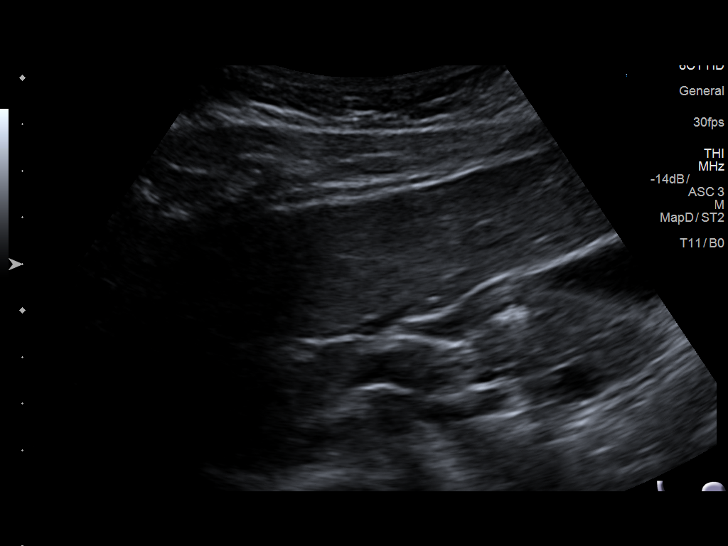
[im 13/35]
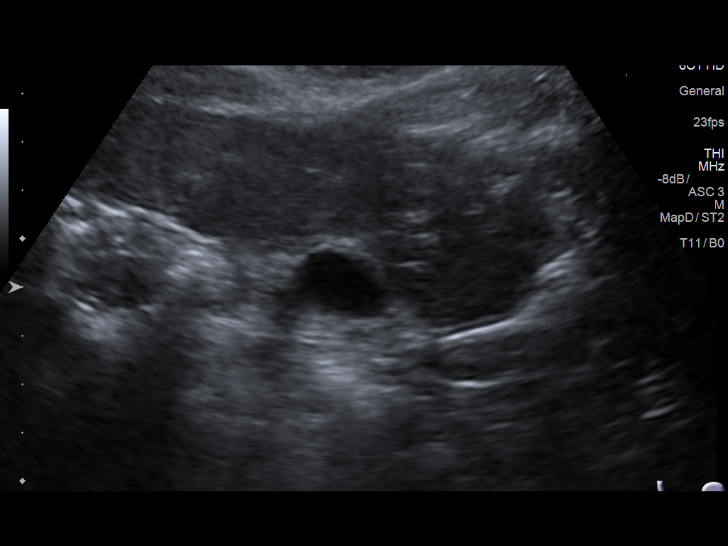
[im 16/35]
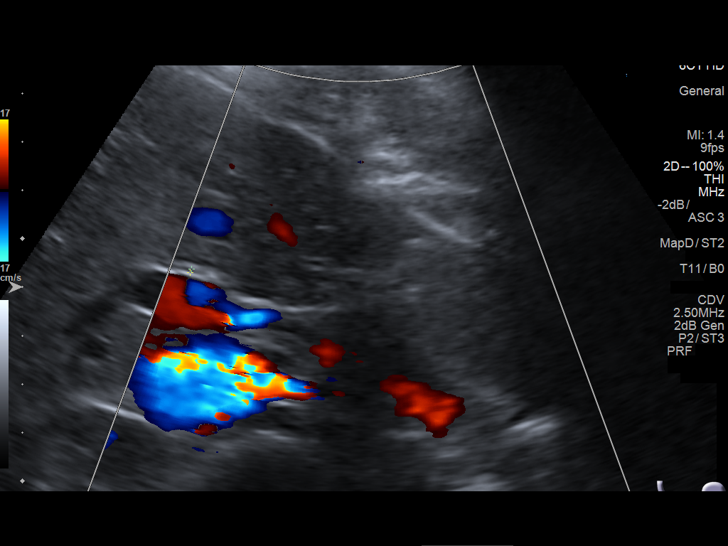
[im 19/35]
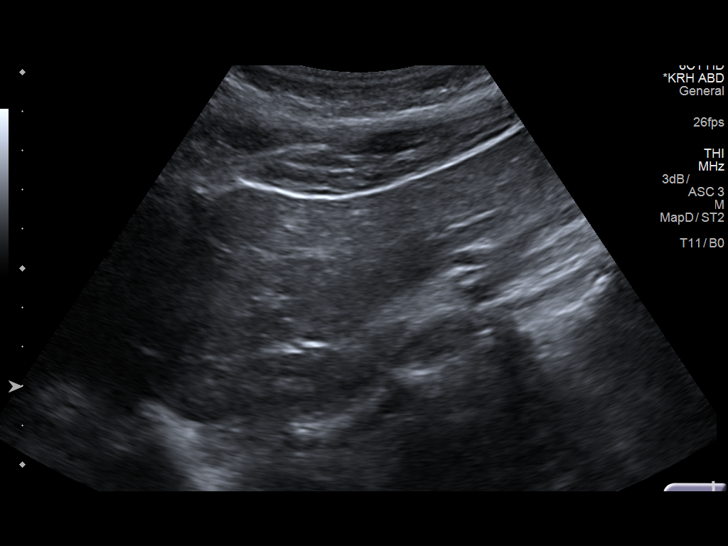
[im 22/35]
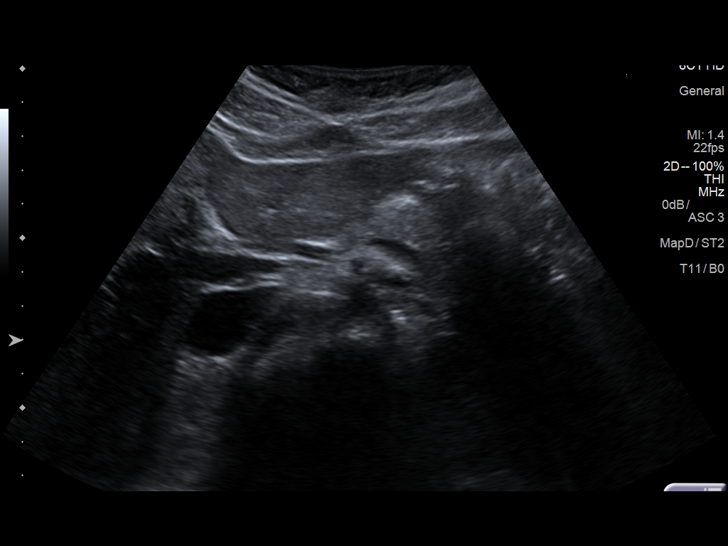
[im 23/35]
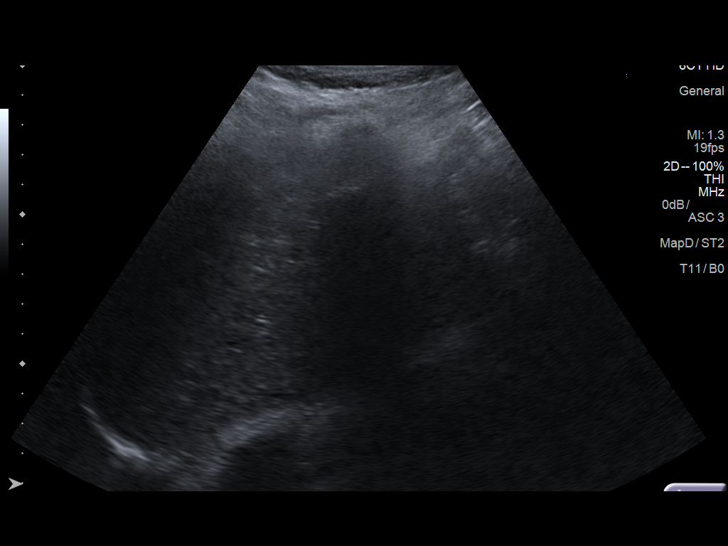
[im 26/35]
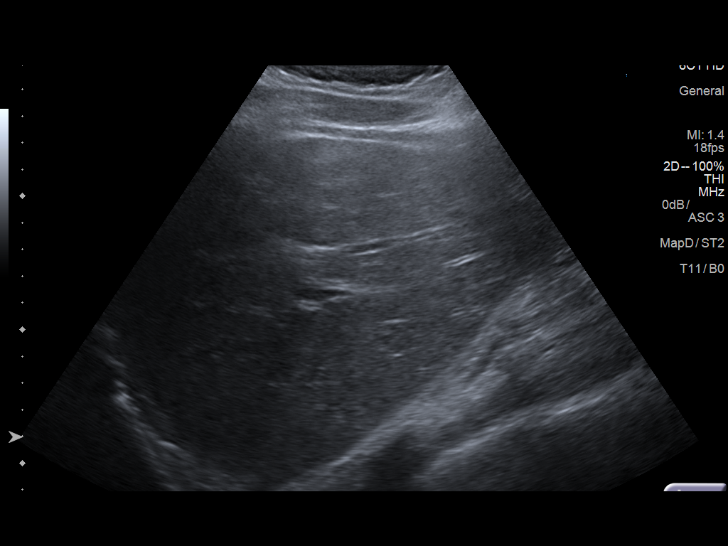
[im 29/35]
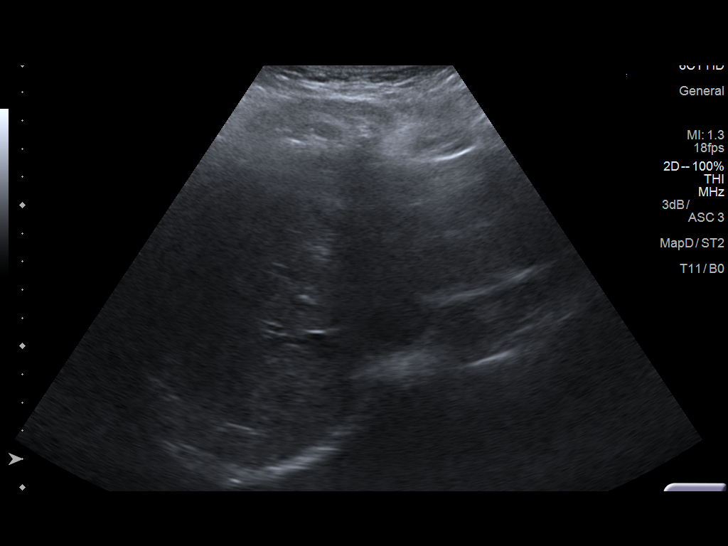
[im 32/35]
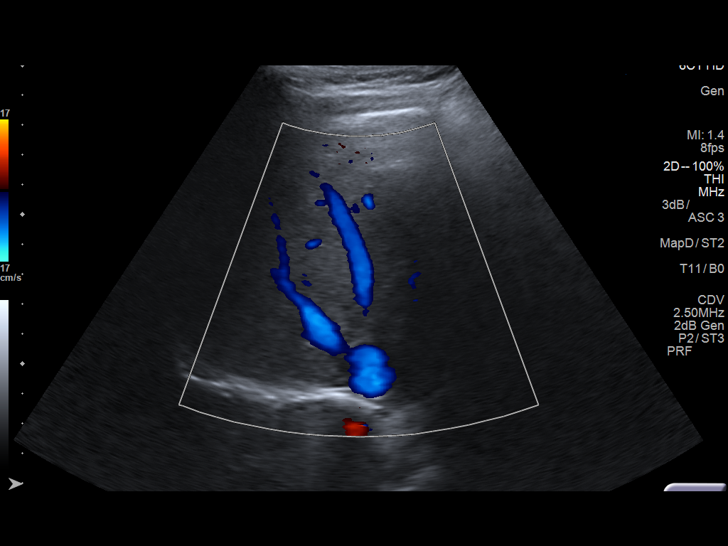
[im 35/35]
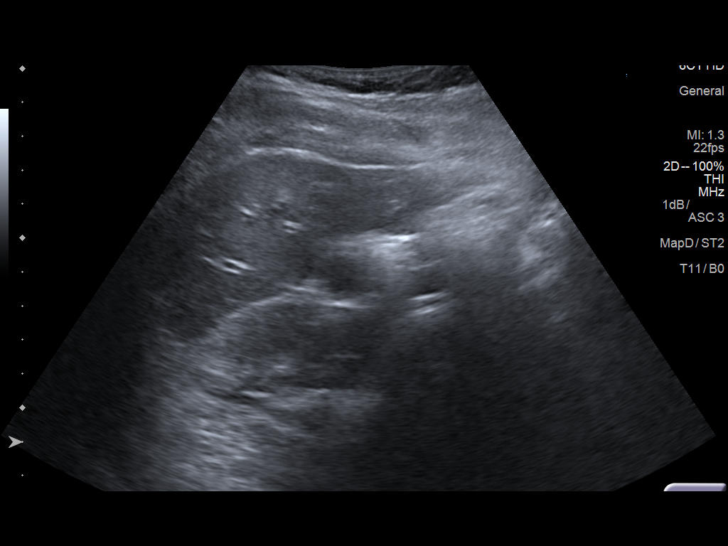

[14 of 25 positions shown; findings below may reference images not displayed]

FINDINGS: Gallbladder:

Gallbladder is mildly contracted. This is nonspecific but may
indicate chronic dysmotility in a fasting patient. Small amount of
sludge in the gallbladder. No stones or wall thickening. Murphy's
sign is negative.

Common bile duct:

Diameter: 1 mm, normal

Liver:

No focal lesion identified. Within normal limits in parenchymal
echogenicity.
IMPRESSION: Gallbladder is mildly contracted with sludge possibly indicating
decreased motility. No changes to suggest acute cholecystitis.
